# Patient Record
Sex: Female | Born: 1955 | Race: White | Hispanic: Yes | Smoking: Never smoker
Health system: Southern US, Community
[De-identification: ages and names within clinical notes are randomized; demographics above are authoritative.]

## PROBLEM LIST (undated history)

## (undated) DIAGNOSIS — K219 Gastro-esophageal reflux disease without esophagitis: Secondary | ICD-10-CM

## (undated) DIAGNOSIS — K8020 Calculus of gallbladder without cholecystitis without obstruction: Secondary | ICD-10-CM

## (undated) DIAGNOSIS — B019 Varicella without complication: Secondary | ICD-10-CM

## (undated) DIAGNOSIS — M8580 Other specified disorders of bone density and structure, unspecified site: Secondary | ICD-10-CM

## (undated) HISTORY — PX: TUBAL LIGATION: SHX77

## (undated) HISTORY — DX: Other specified disorders of bone density and structure, unspecified site: M85.80

## (undated) HISTORY — DX: Varicella without complication: B01.9

---
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
## 1990-09-07 HISTORY — PX: ABDOMINAL HYSTERECTOMY: SHX81

## 1999-09-04 ENCOUNTER — Encounter: Payer: Self-pay | Admitting: Infectious Diseases

## 1999-09-04 ENCOUNTER — Ambulatory Visit (HOSPITAL_COMMUNITY): Admission: RE | Admit: 1999-09-04 | Discharge: 1999-09-04 | Payer: Self-pay | Admitting: Infectious Diseases

## 2000-10-06 ENCOUNTER — Other Ambulatory Visit: Admission: RE | Admit: 2000-10-06 | Discharge: 2000-10-06 | Payer: Self-pay | Admitting: Gynecology

## 2001-02-18 ENCOUNTER — Other Ambulatory Visit: Admission: RE | Admit: 2001-02-18 | Discharge: 2001-02-18 | Payer: Self-pay | Admitting: Gynecology

## 2002-10-31 ENCOUNTER — Encounter: Payer: Self-pay | Admitting: Occupational Medicine

## 2002-10-31 ENCOUNTER — Encounter: Admission: RE | Admit: 2002-10-31 | Discharge: 2002-10-31 | Payer: Self-pay | Admitting: Occupational Medicine

## 2002-12-06 ENCOUNTER — Other Ambulatory Visit: Admission: RE | Admit: 2002-12-06 | Discharge: 2002-12-06 | Payer: Self-pay | Admitting: Gynecology

## 2003-08-22 ENCOUNTER — Other Ambulatory Visit: Admission: RE | Admit: 2003-08-22 | Discharge: 2003-08-22 | Payer: Self-pay | Admitting: Gynecology

## 2004-01-07 ENCOUNTER — Emergency Department (HOSPITAL_COMMUNITY): Admission: EM | Admit: 2004-01-07 | Discharge: 2004-01-07 | Payer: Self-pay | Admitting: Emergency Medicine

## 2004-04-30 ENCOUNTER — Ambulatory Visit (HOSPITAL_COMMUNITY): Admission: RE | Admit: 2004-04-30 | Discharge: 2004-04-30 | Payer: Self-pay | Admitting: *Deleted

## 2004-10-01 ENCOUNTER — Other Ambulatory Visit: Admission: RE | Admit: 2004-10-01 | Discharge: 2004-10-01 | Payer: Self-pay | Admitting: Obstetrics and Gynecology

## 2007-07-27 ENCOUNTER — Encounter: Admission: RE | Admit: 2007-07-27 | Discharge: 2007-07-27 | Payer: Self-pay | Admitting: Unknown Physician Specialty

## 2009-09-07 HISTORY — PX: COLONOSCOPY: SHX174

## 2010-03-11 ENCOUNTER — Ambulatory Visit: Payer: Self-pay | Admitting: Diagnostic Radiology

## 2010-03-12 ENCOUNTER — Emergency Department (HOSPITAL_BASED_OUTPATIENT_CLINIC_OR_DEPARTMENT_OTHER): Admission: EM | Admit: 2010-03-12 | Discharge: 2010-03-12 | Payer: Self-pay | Admitting: Emergency Medicine

## 2010-11-23 LAB — URINE MICROSCOPIC-ADD ON

## 2010-11-23 LAB — URINALYSIS, ROUTINE W REFLEX MICROSCOPIC
Bilirubin Urine: NEGATIVE
Nitrite: NEGATIVE
Specific Gravity, Urine: 1.017 (ref 1.005–1.030)
Urobilinogen, UA: 0.2 mg/dL (ref 0.0–1.0)

## 2010-11-23 LAB — PREGNANCY, URINE: Preg Test, Ur: NEGATIVE

## 2012-01-05 ENCOUNTER — Emergency Department (HOSPITAL_COMMUNITY)
Admission: EM | Admit: 2012-01-05 | Discharge: 2012-01-05 | Disposition: A | Discharge status: Nursing Home (Includes ICF) | Payer: 59 | Source: Home / Self Care | Attending: Emergency Medicine | Admitting: Emergency Medicine

## 2012-01-05 ENCOUNTER — Encounter (HOSPITAL_COMMUNITY): Payer: Self-pay | Admitting: *Deleted

## 2012-01-05 ENCOUNTER — Emergency Department (HOSPITAL_COMMUNITY)
Admission: EM | Admit: 2012-01-05 | Discharge: 2012-01-05 | Disposition: A | Discharge status: Home / Self Care | Payer: 59 | Attending: Emergency Medicine | Admitting: Emergency Medicine

## 2012-01-05 ENCOUNTER — Emergency Department (HOSPITAL_COMMUNITY): Payer: 59

## 2012-01-05 ENCOUNTER — Encounter (HOSPITAL_COMMUNITY): Payer: Self-pay | Admitting: Emergency Medicine

## 2012-01-05 DIAGNOSIS — R112 Nausea with vomiting, unspecified: Secondary | ICD-10-CM | POA: Insufficient documentation

## 2012-01-05 DIAGNOSIS — K3580 Unspecified acute appendicitis: Secondary | ICD-10-CM

## 2012-01-05 DIAGNOSIS — K219 Gastro-esophageal reflux disease without esophagitis: Secondary | ICD-10-CM | POA: Insufficient documentation

## 2012-01-05 DIAGNOSIS — Z79899 Other long term (current) drug therapy: Secondary | ICD-10-CM | POA: Insufficient documentation

## 2012-01-05 DIAGNOSIS — R109 Unspecified abdominal pain: Secondary | ICD-10-CM | POA: Insufficient documentation

## 2012-01-05 HISTORY — DX: Gastro-esophageal reflux disease without esophagitis: K21.9

## 2012-01-05 LAB — COMPREHENSIVE METABOLIC PANEL
Albumin: 3.8 g/dL (ref 3.5–5.2)
BUN: 17 mg/dL (ref 6–23)
Calcium: 9.2 mg/dL (ref 8.4–10.5)
Creatinine, Ser: 0.6 mg/dL (ref 0.50–1.10)
Potassium: 3.7 mEq/L (ref 3.5–5.1)
Total Protein: 7.3 g/dL (ref 6.0–8.3)

## 2012-01-05 LAB — DIFFERENTIAL
Basophils Relative: 0 % (ref 0–1)
Eosinophils Absolute: 0 10*3/uL (ref 0.0–0.7)
Eosinophils Relative: 0 % (ref 0–5)
Monocytes Absolute: 0.4 10*3/uL (ref 0.1–1.0)
Monocytes Relative: 3 % (ref 3–12)
Neutrophils Relative %: 92 % — ABNORMAL HIGH (ref 43–77)

## 2012-01-05 LAB — CBC
Hemoglobin: 13.1 g/dL (ref 12.0–15.0)
MCH: 30 pg (ref 26.0–34.0)
MCHC: 34.4 g/dL (ref 30.0–36.0)

## 2012-01-05 LAB — POCT URINALYSIS DIP (DEVICE)
Bilirubin Urine: NEGATIVE
Nitrite: NEGATIVE
pH: 6.5 (ref 5.0–8.0)

## 2012-01-05 LAB — LIPASE, BLOOD: Lipase: 70 U/L — ABNORMAL HIGH (ref 11–59)

## 2012-01-05 MED ORDER — HYDROMORPHONE HCL PF 1 MG/ML IJ SOLN
1.0000 mg | Freq: Once | INTRAMUSCULAR | Status: AC
  Administered 2012-01-05: 1 mg via INTRAVENOUS
  Filled 2012-01-05: qty 1

## 2012-01-05 MED ORDER — ONDANSETRON HCL 4 MG/2ML IJ SOLN
4.0000 mg | Freq: Once | INTRAMUSCULAR | Status: AC
  Administered 2012-01-05: 4 mg via INTRAVENOUS
  Filled 2012-01-05: qty 2

## 2012-01-05 MED ORDER — IOHEXOL 300 MG/ML  SOLN
20.0000 mL | INTRAMUSCULAR | Status: AC
  Administered 2012-01-05 (×2): 20 mL via ORAL

## 2012-01-05 MED ORDER — HYDROMORPHONE HCL PF 1 MG/ML IJ SOLN
0.5000 mg | Freq: Once | INTRAMUSCULAR | Status: AC
  Administered 2012-01-05: 17:00:00 via INTRAVENOUS
  Filled 2012-01-05: qty 1

## 2012-01-05 MED ORDER — ONDANSETRON 8 MG PO TBDP: 8.0000 mg | ORAL_TABLET | Freq: Three times a day (TID) | ORAL | Status: AC | PRN

## 2012-01-05 MED ORDER — SODIUM CHLORIDE 0.9 % IV SOLN
Freq: Once | INTRAVENOUS | Status: AC
  Administered 2012-01-05: 14:00:00 via INTRAVENOUS

## 2012-01-05 MED ORDER — OXYCODONE-ACETAMINOPHEN 5-325 MG PO TABS: 1.0000 | ORAL_TABLET | Freq: Four times a day (QID) | ORAL | Status: AC | PRN

## 2012-01-05 MED ORDER — IOHEXOL 300 MG/ML  SOLN
100.0000 mL | Freq: Once | INTRAMUSCULAR | Status: AC | PRN
  Administered 2012-01-05: 100 mL via INTRAVENOUS

## 2012-01-05 NOTE — ED Provider Notes (Signed)
Medical screening examination/treatment/procedure(s) were conducted as a shared visit with non-physician practitioner(s) and myself.  I personally evaluated the patient during the encounter   Michell Kader L Cottrell Gentles, MD 01/05/12 1517 

## 2012-01-05 NOTE — ED Provider Notes (Signed)
Patient in CDU pending completion of diagnostic testing in the evaluation of abdominal pain with nausea and vomiting.  Symptoms have improved after IV fluids and medications.  Lab and radiology results reviewed and discussed with patient and with Dr. Estell Harpin.  No clear cause of abdominal symptoms identified.  Patient has a PCP at [facility].   Patient is requested to contact her PCP tomorrow to schedule a follow-up appointment.  Prescriptions for antiemetic and analgesic provided.  Jimmye Norman, NP 01/05/12 1843

## 2012-01-05 NOTE — ED Provider Notes (Signed)
Chief Complaint  Patient presents with  . Abdominal Pain  . GI Problem    History of Present Illness:   Is a 56 year old female who was awakened at 4:30 AM today with left lower quadrant abdominal pain. She describes this as 7/10 in intensity and felt like a soreness. Since then the pain has shifted somewhat toward the right lower quadrant. It is worse if she lies flat in bed and is better if she gets up and walks around. She tried ibuprofen without much relief. She felt nauseated and vomited once. She denies any fever, chills, sweats, anorexia, weight loss. He does tend to be constipated but no blood in the stool. She occasionally has some urinary frequency but denies any dysuria or hematuria. She denies any GYN complaints. Last menstrual period was 3 years ago. She is postmenopausal. She has a history of reflux esophagitis for which she takes Nexium.  Review of Systems:  Other than noted above, the patient denies any of the following symptoms: Constitutional:  No fever, chills, fatigue, weight loss or anorexia. Lungs:  No cough or shortness of breath. Heart:  No chest pain, palpitations, syncope or edema.  No cardiac history. Abdomen:  No nausea, vomiting, hematememesis, melena, diarrhea, or hematochezia. GU:  No dysuria, frequency, urgency, or hematuria. Gyn:  No vaginal discharge, itching, abnormal bleeding or pelvic pain. Skin:  No rash or itching.  PMFSH:  Past medical history, family history, social history, meds, and allergies were reviewed.  No prior abdominal surgeries, past history of GI problems, STDs or GYN problems.  No history of aspirin or NSAID use.  No excessive alcohol intake.  Physical Exam:   Vital signs:  BP 124/63  Pulse 63  Temp(Src) 97.5 F (36.4 C) (Oral)  Resp 16  SpO2 100% Gen:  Alert, oriented, in no distress. Lungs:  Breath sounds clear and equal bilaterally.  No wheezes, rales or rhonchi. Heart:  Regular rhythm.  No gallops or murmers.   Abdomen:  Abdomen  is soft and flat. Bowel sounds are normally active. She has tenderness to palpation in both lower quadrants with guarding in the right lower quadrant. Tenderness is worse in the right lower quadrant of the left. There is no rebound. No organomegaly or mass. Rosvig sign is negative, obturator sign is negative, iliopsoas sign is negative. Skin:  Clear, warm and dry.  No rash.  Labs:   Results for orders placed during the hospital encounter of 01/05/12  POCT URINALYSIS DIP (DEVICE)      Component Value Range   Glucose, UA NEGATIVE  NEGATIVE (mg/dL)   Bilirubin Urine NEGATIVE  NEGATIVE    Ketones, ur 15 (*) NEGATIVE (mg/dL)   Specific Gravity, Urine 1.025  1.005 - 1.030    Hgb urine dipstick TRACE (*) NEGATIVE    pH 6.5  5.0 - 8.0    Protein, ur 30 (*) NEGATIVE (mg/dL)   Urobilinogen, UA 0.2  0.0 - 1.0 (mg/dL)   Nitrite NEGATIVE  NEGATIVE    Leukocytes, UA TRACE (*) NEGATIVE     Assessment:  The encounter diagnosis was Acute appendicitis.  Plan:   1.  The following meds were prescribed:   New Prescriptions   No medications on file   2.  The patient was transferred to the emergency department via shuttle.  Reuben Likes, MD 01/05/12 [phone]

## 2012-01-05 NOTE — ED Notes (Signed)
Pt. Returned from Ct. Scan

## 2012-01-05 NOTE — Discharge Instructions (Signed)
We have determined that your problem requires further evaluation in the emergency department.  We will take care of your transport there.  Once at the emergency department, you will be evaluated by a provider and they will order whatever treatment or tests they deem necessary.  We cannot guarantee that they will do any specific test or do any specific treatment.  ° °

## 2012-01-05 NOTE — ED Provider Notes (Signed)
Medical screening examination/treatment/procedure(s) were conducted as a shared visit with non-physician practitioner(s) and myself.  I personally evaluated the patient during the encounter   Benny Lennert, MD 01/05/12 [phone]

## 2012-01-05 NOTE — ED Notes (Signed)
Pt sent from ucc with developing LLQ pain this am and now radiated to RLQ and tender upon palpation.  Pt is nauseated at triage

## 2012-01-05 NOTE — ED Provider Notes (Signed)
History     CSN: [id number]  Arrival date & time 01/05/12  1253   First MD Initiated Contact with Patient 01/05/12 1346      Chief Complaint  Patient presents with  . R/O appendicitis--from ucc     (Consider location/radiation/quality/duration/timing/severity/associated sxs/prior treatment) Patient is a 56 y.o. female presenting with abdominal pain. The history is provided by the patient (pt complains of abd pain since 430am today). No language interpreter was used.  Abdominal Pain The primary symptoms of the illness include abdominal pain. The primary symptoms of the illness do not include fatigue or diarrhea. The current episode started 3 to 5 hours ago. The onset of the illness was sudden. The problem has not changed since onset. Associated with: nauseau. The patient states that she believes she is currently not pregnant. The patient has not had a change in bowel habit. Symptoms associated with the illness do not include heartburn, hematuria, frequency or back pain. Significant associated medical issues do not include PUD.    Past Medical History  Diagnosis Date  . Acid reflux     History reviewed. No pertinent past surgical history.  No family history on file.  History  Substance Use Topics  . Smoking status: Never Smoker   . Smokeless tobacco: Not on file  . Alcohol Use: No    OB History    Grav Para Term Preterm Abortions TAB SAB Ect Mult Living                  Review of Systems  Constitutional: Negative for fatigue.  HENT: Negative for congestion, sinus pressure and ear discharge.   Eyes: Negative for discharge.  Respiratory: Negative for cough.   Cardiovascular: Negative for chest pain.  Gastrointestinal: Positive for abdominal pain. Negative for heartburn and diarrhea.  Genitourinary: Negative for frequency and hematuria.  Musculoskeletal: Negative for back pain.  Skin: Negative for rash.  Neurological: Negative for seizures and headaches.    Hematological: Negative.   Psychiatric/Behavioral: Negative for hallucinations.    Allergies  Pollen extract  Home Medications   Current Outpatient Rx  Name Route Sig Dispense Refill  . ERGOCALCIFEROL 50000 UNITS PO CAPS Oral Take 50,000 Units by mouth once a week. Saturday    . ESOMEPRAZOLE MAGNESIUM 40 MG PO CPDR Oral Take 40 mg by mouth daily before supper. Take one hour before supper.    . IBUPROFEN 200 MG PO TABS Oral Take 20-800 mg by mouth every 6 (six) hours as needed. As needed for pain.    [name] Kitchen OMEGA-3-ACID ETHYL ESTERS 1 G PO CAPS Oral Take 1 g by mouth daily.      BP 116/57  Pulse 66  Resp 18  SpO2 100%  Physical Exam  Constitutional: She is oriented to person, place, and time. She appears well-developed.  HENT:  Head: Normocephalic and atraumatic.  Eyes: Conjunctivae and EOM are normal. No scleral icterus.  Neck: Neck supple. No thyromegaly present.  Cardiovascular: Normal rate and regular rhythm.  Exam reveals no gallop and no friction rub.   No murmur heard. Pulmonary/Chest: No stridor. She has no wheezes. She has no rales. She exhibits no tenderness.  Abdominal: She exhibits no distension. There is tenderness. There is no rebound.       Moderate epigstric and rlq tendernous  Musculoskeletal: Normal range of motion. She exhibits no edema.  Lymphadenopathy:    She has no cervical adenopathy.  Neurological: She is oriented to person, place, and time. Coordination normal.  Skin:  No rash noted. No erythema.  Psychiatric: She has a normal mood and affect. Her behavior is normal.    ED Course  Procedures (including critical care time)   Labs Reviewed  CBC  DIFFERENTIAL  COMPREHENSIVE METABOLIC PANEL  LIPASE, BLOOD   No results found.   No diagnosis found.    MDM          Benny Lennert, MD 01/05/12 [phone]

## 2012-01-05 NOTE — ED Notes (Signed)
Pt here with left lower abdominal  Achy pain ,nonradiating with nausea and 1 episode of vomiting that started last night after eating at fast food restaurant.denies fever,chills or diarrhea but states she has to drink prune juice EOD FOR effective bm's.hx acid reflux and takes nexium

## 2012-01-05 NOTE — ED Provider Notes (Signed)
Patient is sent to the clinical decision unit by Dr. Estell Harpin for pending CT abdomen and pelvis to rule out appendicitis or any other acute intra-abdominal process. Patient was initially seen in urgent care Center to emergency department for further evaluation right lower quadrant pain. Pending labs and CT scan. Patient is afebrile and in no acute distress. Will continue to monitor and manage pain while remainder of the workup is in progress. She has a nonacute abdomen at this time.  Jenness Corner, [place] 01/05/12 1421

## 2012-01-05 NOTE — Discharge Instructions (Signed)
Nausea and Vomiting  Nausea is a sick feeling that often comes before throwing up (vomiting). Vomiting is a reflex where stomach contents come out of your mouth. Vomiting can cause severe loss of body fluids (dehydration). Children and elderly adults can become dehydrated quickly, especially if they also have diarrhea. Nausea and vomiting are symptoms of a condition or disease. It is important to find the cause of your symptoms.  CAUSES   · Direct irritation of the stomach lining. This irritation can result from increased acid production (gastroesophageal reflux disease), infection, food poisoning, taking certain medicines (such as nonsteroidal anti-inflammatory drugs), alcohol use, or tobacco use.  · Signals from the brain. These signals could be caused by a headache, heat exposure, an inner ear disturbance, increased pressure in the brain from injury, infection, a tumor, or a concussion, pain, emotional stimulus, or metabolic problems.  · An obstruction in the gastrointestinal tract (bowel obstruction).  · Illnesses such as diabetes, hepatitis, gallbladder problems, appendicitis, kidney problems, cancer, sepsis, atypical symptoms of a heart attack, or eating disorders.  · Medical treatments such as chemotherapy and radiation.  · Receiving medicine that makes you sleep (general anesthetic) during surgery.  DIAGNOSIS  Your caregiver may ask for tests to be done if the problems do not improve after a few days. Tests may also be done if symptoms are severe or if the reason for the nausea and vomiting is not clear. Tests may include:  · Urine tests.  · Blood tests.  · Stool tests.  · Cultures (to look for evidence of infection).  · X-rays or other imaging studies.  Test results can help your caregiver make decisions about treatment or the need for additional tests.  TREATMENT  You need to stay well hydrated. Drink frequently but in small amounts. You may wish to drink water, sports drinks, clear broth, or eat frozen  ice pops or gelatin dessert to help stay hydrated. When you eat, eating slowly may help prevent nausea. There are also some antinausea medicines that may help prevent nausea.  HOME CARE INSTRUCTIONS   · Take all medicine as directed by your caregiver.  · If you do not have an appetite, do not force yourself to eat. However, you must continue to drink fluids.  · If you have an appetite, eat a normal diet unless your caregiver tells you differently.  · Eat a variety of complex carbohydrates (rice, wheat, potatoes, bread), lean meats, yogurt, fruits, and vegetables.  · Avoid high-fat foods because they are more difficult to digest.  · Drink enough water and fluids to keep your urine clear or pale yellow.  · If you are dehydrated, ask your caregiver for specific rehydration instructions. Signs of dehydration may include:  · Severe thirst.  · Dry lips and mouth.  · Dizziness.  · Dark urine.  · Decreasing urine frequency and amount.  · Confusion.  · Rapid breathing or pulse.  SEEK IMMEDIATE MEDICAL CARE IF:   · You have blood or brown flecks (like coffee grounds) in your vomit.  · You have black or bloody stools.  · You have a severe headache or stiff neck.  · You are confused.  · You have severe abdominal pain.  · You have chest pain or trouble breathing.  · You do not urinate at least once every 8 hours.  · You develop cold or clammy skin.  · You continue to vomit for longer than 24 to 48 hours.  · You have a fever.  MAKE SURE YOU:   ·   Understand these instructions.  · Will watch your condition.  · Will get help right away if you are not doing well or get worse.  Document Released: 08/24/2005 Document Revised: 08/13/2011 Document Reviewed: 01/21/2011  ExitCare® Patient Information ©2012 [employer].    Abdominal Pain (Nonspecific)  Your exam might not show the exact reason you have abdominal pain. Since there are many different causes of abdominal pain, another checkup and more tests may be needed. It is very  important to follow up for lasting (persistent) or worsening symptoms. A possible cause of abdominal pain in any person who still has his or her appendix is acute appendicitis. Appendicitis is often hard to diagnose. Normal blood tests, urine tests, ultrasound, and CT scans do not completely rule out early appendicitis or other causes of abdominal pain. Sometimes, only the changes that happen over time will allow appendicitis and other causes of abdominal pain to be determined. Other potential problems that may require surgery may also take time to become more apparent. Because of this, it is important that you follow all of the instructions below.  HOME CARE INSTRUCTIONS   · Rest as much as possible.  · Do not eat solid food until your pain is gone.  · While adults or children have pain: A diet of water, weak decaffeinated tea, broth or bouillon, gelatin, oral rehydration solutions (ORS), frozen ice pops, or ice chips may be helpful.  · When pain is gone in adults or children: Start a light diet (dry toast, crackers, applesauce, or white rice). Increase the diet slowly as long as it does not bother you. Eat no dairy products (including cheese and eggs) and no spicy, fatty, fried, or high-fiber foods.  · Use no alcohol, caffeine, or cigarettes.  · Take your regular medicines unless your caregiver told you not to.  · Take any prescribed medicine as directed.  · Only take over-the-counter or prescription medicines for pain, discomfort, or fever as directed by your caregiver. Do not give aspirin to children.  If your caregiver has given you a follow-up appointment, it is very important to keep that appointment. Not keeping the appointment could result in a permanent injury and/or lasting (chronic) pain and/or disability. If there is any problem keeping the appointment, you must call to reschedule.   SEEK IMMEDIATE MEDICAL CARE IF:   · Your pain is not gone in 24 hours.  · Your pain becomes worse, changes location, or  feels different.  · You or your child has an oral temperature above 102° F (38.9° C), not controlled by medicine.  · Your baby is older than 3 months with a rectal temperature of 102° F (38.9° C) or higher.  · Your baby is 3 months old or younger with a rectal temperature of 100.4° F (38° C) or higher.  · You have shaking chills.  · You keep throwing up (vomiting) or cannot drink liquids.  · There is blood in your vomit or you see blood in your bowel movements.  · Your bowel movements become dark or black.  · You have frequent bowel movements.  · Your bowel movements stop (become blocked) or you cannot pass gas.  · You have bloody, frequent, or painful urination.  · You have yellow discoloration in the skin or whites of the eyes.  · Your stomach becomes bloated or bigger.  · You have dizziness or fainting.  · You have chest or back pain.  MAKE SURE YOU:   · Understand these   instructions.  · Will watch your condition.  · Will get help right away if you are not doing well or get worse.  Document Released: 08/24/2005 Document Revised: 08/13/2011 Document Reviewed: 07/22/2009  ExitCare® Patient Information ©2012 [employer].

## 2012-01-18 ENCOUNTER — Encounter: Payer: Self-pay | Admitting: Gastroenterology

## 2012-01-18 ENCOUNTER — Telehealth: Payer: Self-pay | Admitting: Gastroenterology

## 2012-01-18 NOTE — Telephone Encounter (Signed)
Pt states she is having a lot of pain from her reflux and stomach pain. States she cannot wait until June to be seen. Pt scheduled to see Mike Gip PA tomorrow at 10am. Pt aware of appt date and time.

## 2012-01-19 ENCOUNTER — Other Ambulatory Visit (INDEPENDENT_AMBULATORY_CARE_PROVIDER_SITE_OTHER): Payer: 59

## 2012-01-19 ENCOUNTER — Ambulatory Visit (INDEPENDENT_AMBULATORY_CARE_PROVIDER_SITE_OTHER)
Admission: RE | Admit: 2012-01-19 | Discharge: 2012-01-19 | Disposition: A | Discharge status: Home / Self Care | Payer: 59 | Source: Ambulatory Visit | Attending: Physician Assistant | Admitting: Physician Assistant

## 2012-01-19 ENCOUNTER — Ambulatory Visit (INDEPENDENT_AMBULATORY_CARE_PROVIDER_SITE_OTHER): Payer: 59 | Admitting: Physician Assistant

## 2012-01-19 ENCOUNTER — Encounter: Payer: Self-pay | Admitting: Physician Assistant

## 2012-01-19 VITALS — BP 110/70 | HR 76 | Ht 59.0 in | Wt 122.6 lb

## 2012-01-19 DIAGNOSIS — R935 Abnormal findings on diagnostic imaging of other abdominal regions, including retroperitoneum: Secondary | ICD-10-CM

## 2012-01-19 DIAGNOSIS — R109 Unspecified abdominal pain: Secondary | ICD-10-CM

## 2012-01-19 DIAGNOSIS — R1030 Lower abdominal pain, unspecified: Secondary | ICD-10-CM

## 2012-01-19 DIAGNOSIS — Z90710 Acquired absence of both cervix and uterus: Secondary | ICD-10-CM | POA: Insufficient documentation

## 2012-01-19 DIAGNOSIS — K219 Gastro-esophageal reflux disease without esophagitis: Secondary | ICD-10-CM

## 2012-01-19 LAB — CBC WITH DIFFERENTIAL/PLATELET
Eosinophils Relative: 0.6 % (ref 0.0–5.0)
HCT: 40.3 % (ref 36.0–46.0)
Hemoglobin: 13.4 g/dL (ref 12.0–15.0)
Lymphocytes Relative: 23.5 % (ref 12.0–46.0)
Lymphs Abs: 1.8 10*3/uL (ref 0.7–4.0)
Monocytes Relative: 4.3 % (ref 3.0–12.0)
Neutro Abs: 5.6 10*3/uL (ref 1.4–7.7)
RBC: 4.5 Mil/uL (ref 3.87–5.11)
WBC: 7.8 10*3/uL (ref 4.5–10.5)

## 2012-01-19 LAB — HEPATIC FUNCTION PANEL
AST: 19 U/L (ref 0–37)
Albumin: 3.9 g/dL (ref 3.5–5.2)
Alkaline Phosphatase: 59 U/L (ref 39–117)
Bilirubin, Direct: 0.1 mg/dL (ref 0.0–0.3)
Total Protein: 7.6 g/dL (ref 6.0–8.3)

## 2012-01-19 LAB — LIPASE: Lipase: 50 U/L (ref 11.0–59.0)

## 2012-01-19 MED ORDER — IOHEXOL 300 MG/ML  SOLN
100.0000 mL | Freq: Once | INTRAMUSCULAR | Status: AC | PRN
  Administered 2012-01-19: 100 mL via INTRAVENOUS

## 2012-01-19 MED ORDER — DEXLANSOPRAZOLE 60 MG PO CPDR: 60.0000 mg | DELAYED_RELEASE_CAPSULE | Freq: Every day | ORAL | Status: DC
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
## 2012-01-19 NOTE — Patient Instructions (Signed)
Please go to the basement level to have your labs drawn.  We sent Dexilant prescription to [facility].  We have scheduled the CT Enterography for today, Directions given.  You have been scheduled for a CT scan of the abdomen and pelvis at  CT ([address]---this is in the same building as Architectural technologist).   You are scheduled on 01-19-2012 at 4:00 PM  You should arrive 15 minutes prior to your appointment time for registration. Please follow the written instructions below on the day of your exam:  WARNING: IF YOU ARE ALLERGIC TO IODINE/X-RAY DYE, PLEASE NOTIFY RADIOLOGY IMMEDIATELY AT [phone]! YOU WILL BE GIVEN A 13 HOUR PREMEDICATION PREP.  1) Do not eat or drink anything after Noon  (4 hours prior to your test) You will arrive at 2:45 PM and will drink contrast at [facility] CT office.   You may take any medications as prescribed with a small amount of water except for the following: Metformin, Glucophage, Glucovance, Avandamet, Riomet, Fortamet, Actoplus Met, Janumet, Glumetza or Metaglip. The above medications must be held the day of the exam AND 48 hours after the exam.  The purpose of you drinking the oral contrast is to aid in the visualization of your intestinal tract. The contrast solution may cause some diarrhea. Before your exam is started, you will be given a small amount of fluid to drink. Depending on your individual set of symptoms, you may also receive an intravenous injection of x-ray contrast/dye. Plan on being at [facility] for 30 minutes or long, depending on the type of exam you are having performed.  If you have any questions regarding your exam or if you need to reschedule, you may call the CT department at [phone] between the hours of 8:00 am and 5:00 pm, Monday-Friday.  ________________________________________________________________________

## 2012-01-19 NOTE — Progress Notes (Signed)
i agree with the plan outlined in this note 

## 2012-01-20 ENCOUNTER — Telehealth: Payer: Self-pay | Admitting: Physician Assistant

## 2012-01-20 NOTE — Telephone Encounter (Signed)
Spoke with patient and told her per Mike Gip, PA note the CT is negative but she is having the MD review it and decide if she will need a colonoscopy.

## 2012-01-22 ENCOUNTER — Encounter: Payer: Self-pay | Admitting: *Deleted

## 2012-01-22 ENCOUNTER — Telehealth: Payer: Self-pay | Admitting: Physician Assistant

## 2012-01-22 NOTE — Telephone Encounter (Signed)
Letter up front for pick up by patient.

## 2012-01-22 NOTE — Telephone Encounter (Signed)
Yes, that is ok. thanks

## 2012-01-22 NOTE — Telephone Encounter (Signed)
Patient saw Mike Gip, PA on 01/19/12 for Left Lower abdominal pain and was given a note to be out of work this week. She needs  A note to be released to go back to work. Dr. Christella Hartigan, Amy is off today and you were supervising on 01/19/12. May I give her a note to return to work on Monday? Please, advise.

## 2012-01-27 ENCOUNTER — Telehealth: Payer: Self-pay | Admitting: *Deleted

## 2012-01-27 NOTE — Telephone Encounter (Signed)
Spoke with patient and scheduled OV on 02/29/12 8:45 AM with Dr. Christella Hartigan

## 2012-01-27 NOTE — Telephone Encounter (Signed)
Message copied by Daphine Deutscher on Wed Jan 27, 2012  8:57 AM ------      Message from: Mike Gip S      Created: Tue Jan 26, 2012  5:23 PM       [facility], just get her to have a follow up  appt with dr. Christella Hartigan

## 2012-02-23 ENCOUNTER — Ambulatory Visit: Payer: 59 | Admitting: Gastroenterology

## 2012-02-29 ENCOUNTER — Ambulatory Visit (INDEPENDENT_AMBULATORY_CARE_PROVIDER_SITE_OTHER): Payer: 59 | Admitting: Gastroenterology

## 2012-02-29 ENCOUNTER — Encounter: Payer: Self-pay | Admitting: Gastroenterology

## 2012-02-29 VITALS — BP 110/60 | HR 74 | Ht 59.0 in | Wt 126.0 lb

## 2012-02-29 DIAGNOSIS — R1032 Left lower quadrant pain: Secondary | ICD-10-CM

## 2012-02-29 MED ORDER — ESOMEPRAZOLE MAGNESIUM 40 MG PO CPDR: 40.0000 mg | DELAYED_RELEASE_CAPSULE | Freq: Every day | ORAL | Status: DC

## 2012-02-29 NOTE — Progress Notes (Signed)
HPI: This is a  very pleasant 56 year old woman whom I am meeting for the first time. She was seen in this office by Amy about a month ago. She was having left-sided abdominal planes of unclear etiology they seemed very positional. She had had a colonoscopy by Dr. Noe Gens in 2011 and tells [place] it was normal. She was set up with a CT enterography and it was normal.  "I feel better."  The pains have completely resolved.  Pains were left side, very positional related.  Gone now.  She has been eating well, normal bowels for her (chronic constipation).  Drinks a lot of water, veggies, fruits.   CT enteroscopy last was normal  Past Medical History  Diagnosis Date  . Acid reflux     Past Surgical History  Procedure Date  . Abdominal hysterectomy   . Colonoscopy 2010    Dr.     Current Outpatient Prescriptions  Medication Sig Dispense Refill  . ergocalciferol (VITAMIN D2) 50000 UNITS capsule Take 50,000 Units by mouth once a week. Saturday      . esomeprazole (NEXIUM) 40 MG capsule Take 40 mg by mouth daily before supper. Take one hour before supper.      [name] Kitchen ibuprofen (ADVIL,MOTRIN) 200 MG tablet Take 20-800 mg by mouth every 6 (six) hours as needed. As needed for pain.      [name] Kitchen omega-3 acid ethyl esters (LOVAZA) 1 G capsule Take 1 g by mouth daily.        Allergies as of 02/29/2012 - Review Complete 02/29/2012  Allergen Reaction Noted  . Pollen extract Other (See Comments) 01/05/2012    Family History  Problem Relation Age of Onset  . Colon cancer Neg Hx     History   Social History  . Marital Status: Widowed    Spouse Name: N/A    Number of Children: 2  . 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 Smoking status: Never Smoker   . Smokeless tobacco: Never Used  . Alcohol Use: No  . Drug Use: No  . Sexually Active: Not on file   Other Topics Concern  . Not on file   Social History Narrative  . No narrative  on file      Physical Exam: BP 110/60  Pulse 74  Ht 4\' 11"  (1.499 m)  Wt 126 lb (57.153 kg)  BMI 25.45 kg/m2 Constitutional: generally well-appearing Psychiatric: alert and oriented x3 Abdomen: soft, nontender, nondistended, no obvious ascites, no peritoneal signs, normal bowel sounds     Assessment and plan: 55 y.o. female with resolved left-sided abdominal pains  Given normal CT scan, recently normal colonoscopy in 2011, essentially normal labs I see no reason for further workup at this point since her pains have resolved. She is to call here she has any further questions or concerns

## 2012-02-29 NOTE — Patient Instructions (Addendum)
Since you are feeling so much better, there is no need for repeat colonoscopy. Call with any future troubles.

## 2012-08-17 DIAGNOSIS — L84 Corns and callosities: Secondary | ICD-10-CM | POA: Insufficient documentation

## 2012-08-17 DIAGNOSIS — B353 Tinea pedis: Secondary | ICD-10-CM | POA: Insufficient documentation

## 2012-08-17 DIAGNOSIS — M79672 Pain in left foot: Secondary | ICD-10-CM | POA: Insufficient documentation

## 2012-08-17 DIAGNOSIS — M21969 Unspecified acquired deformity of unspecified lower leg: Secondary | ICD-10-CM | POA: Insufficient documentation

## 2012-08-17 DIAGNOSIS — M79671 Pain in right foot: Secondary | ICD-10-CM | POA: Insufficient documentation

## 2012-08-17 DIAGNOSIS — M6700 Short Achilles tendon (acquired), unspecified ankle: Secondary | ICD-10-CM | POA: Insufficient documentation

## 2012-08-20 ENCOUNTER — Encounter: Payer: Self-pay | Admitting: Podiatry

## 2012-08-20 DIAGNOSIS — L84 Corns and callosities: Secondary | ICD-10-CM

## 2012-08-20 DIAGNOSIS — M21969 Unspecified acquired deformity of unspecified lower leg: Secondary | ICD-10-CM

## 2012-08-20 DIAGNOSIS — B353 Tinea pedis: Secondary | ICD-10-CM

## 2012-08-20 DIAGNOSIS — M79672 Pain in left foot: Secondary | ICD-10-CM

## 2012-08-20 DIAGNOSIS — M79671 Pain in right foot: Secondary | ICD-10-CM

## 2012-08-20 DIAGNOSIS — M6700 Short Achilles tendon (acquired), unspecified ankle: Secondary | ICD-10-CM

## 2012-09-01 HISTORY — PX: OTHER SURGICAL HISTORY: SHX169

## 2012-11-04 ENCOUNTER — Encounter: Payer: Self-pay | Admitting: Gastroenterology

## 2012-11-04 ENCOUNTER — Ambulatory Visit (INDEPENDENT_AMBULATORY_CARE_PROVIDER_SITE_OTHER): Payer: 59 | Admitting: Gastroenterology

## 2012-11-04 VITALS — BP 104/70 | HR 84 | Ht 58.25 in | Wt 130.2 lb

## 2012-11-04 DIAGNOSIS — K219 Gastro-esophageal reflux disease without esophagitis: Secondary | ICD-10-CM

## 2012-11-04 DIAGNOSIS — R1314 Dysphagia, pharyngoesophageal phase: Secondary | ICD-10-CM

## 2012-11-04 NOTE — Patient Instructions (Addendum)
Continue dexilant daily. Start OTC pepcid, take at bedtime every night. You will be set up for an upper endoscopy for pyrosis, dysphagia (moderate sedation, LEC).

## 2012-11-04 NOTE — Progress Notes (Signed)
Review of pertinent gastrointestinal problems: 1. Routine risk for colon cancer: colonsocopy Dr. Noe Gens in 2011, per patient was normal 2. Intermittent abd pain: CT abd/pelvis scan twice in 2013 essentially normal; labs 2013 normal CBC, normal CMET   HPI: This is a  very pleasant 57 year old woman whom I last saw about a year ago.    The llq pains have stopped.    Here for a new problem.  GERD for many years.  Took nexium for 2 years, worked well.  Currently taking dexlinant once a day for past 3 months.  Still bothered by pyrosis and belching.  + dysphagia to solids about once per month.   Overall weight is up 7-8 pounds in past 2 months.  Ibuprofen rarely.    Rare caffeine, no etoh,    she tells me her pyrosis symptoms are clearly worse at night, when she's laying down in bed.   Review of systems: Pertinent positive and negative review of systems were noted in the above HPI section. Complete review of systems was performed and was otherwise normal.    Past Medical History  Diagnosis Date  . Acid reflux     Past Surgical History  Procedure Laterality Date  . Abdominal hysterectomy    . Colonoscopy  2010    Dr.   . [place] [place] surgery Left     bone spur    Current Outpatient Prescriptions  Medication Sig Dispense Refill  . dexlansoprazole (DEXILANT) 60 MG capsule Take 60 mg by mouth daily.      [name] Kitchen ibuprofen (ADVIL,MOTRIN) 200 MG tablet Take 20-800 mg by mouth every 6 (six) hours as needed. As needed for pain.      . Vitamin D, Cholecalciferol, 400 UNITS CHEW Chew 1 tablet by mouth 2 (two) times daily.       No current facility-administered medications for this visit.    Allergies as of 11/04/2012 - Review Complete 11/04/2012  Allergen Reaction Noted  . Pollen extract Other (See Comments) 01/05/2012    Family History  Problem Relation Age of Onset  . Colon cancer Neg Hx     History   Social History  . Marital Status: Widowed    Spouse Name: N/A    Number of  Children: 2  . 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 Smoking status: Never Smoker   . Smokeless tobacco: Never Used  . Alcohol Use: No  . Drug Use: No  . Sexually Active: Not on file   Other Topics Concern  . Not on file   Social History Narrative  . No narrative on file       Physical Exam: Ht 4' 10.25" (1.48 m)  Wt 130 lb 4 oz (59.081 kg)  BMI 26.97 kg/m2 Constitutional: generally well-appearing Psychiatric: alert and oriented x3 Eyes: extraocular movements intact Mouth: oral pharynx moist, no lesions Neck: supple no lymphadenopathy Cardiovascular: heart regular rate and rhythm Lungs: clear to auscultation bilaterally Abdomen: soft, nontender, nondistended, no obvious ascites, no peritoneal signs, normal bowel sounds Extremities: no lower extremity edema bilaterally Skin: no lesions on visible extremities    Assessment and plan: 57 y.o. female with  chronic GERD, worse at night, intermittent dysphasia  should proceed with EGD at her soonest convenience to evaluate for GERD can medication such as strictures, perhaps Barrett's esophagus or neoplasm however I think that is unlikely. Her GERD symptoms seem worse at night I recommended she  try bedtime H2 blocker in addition to her daily proton pump inhibitor

## 2012-11-07 ENCOUNTER — Encounter: Payer: Self-pay | Admitting: Gastroenterology

## 2012-11-07 ENCOUNTER — Ambulatory Visit (AMBULATORY_SURGERY_CENTER): Payer: 59 | Admitting: Gastroenterology

## 2012-11-07 VITALS — BP 99/50 | HR 55 | Temp 96.6°F | Resp 12 | Ht 59.0 in | Wt 122.0 lb

## 2012-11-07 DIAGNOSIS — R1314 Dysphagia, pharyngoesophageal phase: Secondary | ICD-10-CM

## 2012-11-07 DIAGNOSIS — K219 Gastro-esophageal reflux disease without esophagitis: Secondary | ICD-10-CM

## 2012-11-07 DIAGNOSIS — R1032 Left lower quadrant pain: Secondary | ICD-10-CM

## 2012-11-07 MED ORDER — SODIUM CHLORIDE 0.9 % IV SOLN: 500.0000 mL | INTRAVENOUS | Status: DC

## 2012-11-07 NOTE — Progress Notes (Signed)
Patient did not experience any of the following events: a burn prior to discharge; a fall within the facility; wrong site/side/patient/procedure/implant event; or a hospital transfer or hospital admission upon discharge from the facility. (G8907) Patient did not have preoperative order for IV antibiotic SSI prophylaxis. (G8918)  

## 2012-11-07 NOTE — Op Note (Signed)
[facility] [address] [place], [address]   ENDOSCOPY PROCEDURE REPORT  PATIENT: Marissa, Grant  MR#: [id number] BIRTHDATE: July 19, 1956 , 57  yrs. old GENDER: Female ENDOSCOPIST: Rachael Fee, MD PROCEDURE DATE:  11/07/2012 PROCEDURE:  EGD, diagnostic ASA CLASS:     Class III INDICATIONS:  GERD, intermittent dysphagia. MEDICATIONS: Fentanyl 50 mcg IV, Versed 6 mg IV, and These medications were titrated to patient response per physician's verbal order TOPICAL ANESTHETIC: Cetacaine Spray  DESCRIPTION OF PROCEDURE: After the risks benefits and alternatives of the procedure were thoroughly explained, informed consent was obtained.  The LB GIF-H180 [id number] endoscope was introduced through the mouth and advanced to the second portion of the duodenum. Without limitations.  The instrument was slowly withdrawn as the mucosa was fully examined.    The upper, middle and distal third of the esophagus were carefully inspected and no abnormalities were noted.  The z-line was well seen at the GEJ.  The endoscope was pushed into the fundus which was normal including a retroflexed view.  The antrum, gastric body, first and second part of the duodenum were unremarkable. Retroflexed views revealed no abnormalities.     The scope was then withdrawn from the patient and the procedure completed. COMPLICATIONS: There were no complications.  ENDOSCOPIC IMPRESSION: Normal EGD  RECOMMENDATIONS: Please continue dexilant once daily and also start H2 blocker (such as pepcid or zantac), one pill every night at bedtime.    eSigned:  Rachael Fee, MD 11/07/2012 10:30 AM

## 2012-11-07 NOTE — Patient Instructions (Signed)
Continue your Dexilant once daily. Add a H2 blocker,Pepcid or Zantac  One pil every night.   YOU HAD AN ENDOSCOPIC PROCEDURE TODAY AT THE [facility]: Refer to the procedure report that was given to you for any specific questions about what was found during the examination.  If the procedure report does not answer your questions, please call your gastroenterologist to clarify.  If you requested that your care partner not be given the details of your procedure findings, then the procedure report has been included in a sealed envelope for you to review at your convenience later.  YOU SHOULD EXPECT: Some feelings of bloating in the abdomen. Passage of more gas than usual.  Walking can help get rid of the air that was put into your GI tract during the procedure and reduce the bloating. If you had a lower endoscopy (such as a colonoscopy or flexible sigmoidoscopy) you may notice spotting of blood in your stool or on the toilet paper. If you underwent a bowel prep for your procedure, then you may not have a normal bowel movement for a few days.  DIET: Your first meal following the procedure should be a light meal and then it is ok to progress to your normal diet.  A half-sandwich or bowl of soup is an example of a good first meal.  Heavy or fried foods are harder to digest and may make you feel nauseous or bloated.  Likewise meals heavy in dairy and vegetables can cause extra gas to form and this can also increase the bloating.  Drink plenty of fluids but you should avoid alcoholic beverages for 24 hours.  ACTIVITY: Your care partner should take you home directly after the procedure.  You should plan to take it easy, moving slowly for the rest of the day.  You can resume normal activity the day after the procedure however you should NOT DRIVE or use heavy machinery for 24 hours (because of the sedation medicines used during the test).    SYMPTOMS TO REPORT IMMEDIATELY: A gastroenterologist can be  reached at any hour.  During normal business hours, 8:30 AM to 5:00 PM Monday through Friday, call [phone].  After hours and on weekends, please call the GI answering service at [phone] who will take a message and have the physician on call contact you.  Following upper endoscopy (EGD)  Vomiting of blood or coffee ground material  New chest pain or pain under the shoulder blades  Painful or persistently difficult swallowing  New shortness of breath  Fever of 100F or higher  Black, tarry-looking stools  FOLLOW UP: If any biopsies were taken you will be contacted by phone or by letter within the next 1-3 weeks.  Call your gastroenterologist if you have not heard about the biopsies in 3 weeks.  Our staff will call the home number listed on your records the next business day following your procedure to check on you and address any questions or concerns that you may have at that time regarding the information given to you following your procedure. This is a courtesy call and so if there is no answer at the home number and we have not heard from you through the emergency physician on call, we will assume that you have returned to your regular daily activities without incident.  SIGNATURES/CONFIDENTIALITY: You and/or your care partner have signed paperwork which will be entered into your electronic medical record.  These signatures attest to the fact that that  the information above on your After Visit Summary has been reviewed and is understood.  Full responsibility of the confidentiality of this discharge information lies with you and/or your care-partner.

## 2012-11-07 NOTE — Progress Notes (Signed)
Patient drinking water on arrival, MD notified. Procedure delayed until 2 hours NPO. Patient aware.

## 2012-11-07 NOTE — Progress Notes (Signed)
Patient's head of bed elevated gradually until 90 degrees, vitals signs stable.

## 2012-11-08 ENCOUNTER — Telehealth: Payer: Self-pay | Admitting: *Deleted

## 2012-11-08 NOTE — Telephone Encounter (Signed)
  Follow up Call-  Call back number 11/07/2012  Post procedure Call Back phone  # [phone]  Permission to leave phone message Yes     Patient questions:  Do you have a fever, pain , or abdominal swelling? no Pain Score  0 *  Have you tolerated food without any problems? yes  Have you been able to return to your normal activities? yes  Do you have any questions about your discharge instructions: Diet   no Medications  no Follow up visit  no  Do you have questions or concerns about your Care? no  Actions: * If pain score is 4 or above: No action needed, pain <4.

## 2012-11-14 ENCOUNTER — Encounter: Payer: Self-pay | Admitting: Podiatry

## 2012-11-16 ENCOUNTER — Other Ambulatory Visit: Payer: Self-pay | Admitting: Podiatry

## 2012-11-17 ENCOUNTER — Telehealth: Payer: Self-pay | Admitting: Podiatry

## 2012-11-17 NOTE — Telephone Encounter (Signed)
Telephone encounter opened by error.

## 2012-11-22 ENCOUNTER — Encounter: Payer: Self-pay | Admitting: Podiatry

## 2012-11-22 ENCOUNTER — Ambulatory Visit (INDEPENDENT_AMBULATORY_CARE_PROVIDER_SITE_OTHER): Payer: 59 | Admitting: Podiatry

## 2012-11-22 VITALS — BP 117/53 | HR 72 | Wt 131.0 lb

## 2012-11-22 DIAGNOSIS — Z9889 Other specified postprocedural states: Secondary | ICD-10-CM

## 2012-11-22 MED ORDER — NABUMETONE 500 MG PO TABS: 500.0000 mg | ORAL_TABLET | Freq: Two times a day (BID) | ORAL | Status: DC

## 2012-11-22 NOTE — Progress Notes (Signed)
Subjective  Status post 11 and a half weeks osseous foot surgery post op visit. Patient is ambulatory in  Regular shoes.  Denies having fever or chill. Pain does not need any medications at this time. Patient denies having any pain.  Objective  Surgical wound well coapted. No pain upon range of motion. Last X-ray reviewed. Good approximation and incorporation of grafted bone. Assessment: Satisfactory bone healing following Cotton osteotomy. Plan: Release to return to work. Take Relafen as needed.

## 2012-11-22 NOTE — Patient Instructions (Signed)
11 and half weeks of post foot surgery.  Healing is well and is according to schedule. Ok to involve in regular activity as tolerated. May return to regular work without restrictions as tolerated. Take medications as instructed.

## 2013-02-16 ENCOUNTER — Other Ambulatory Visit: Payer: Self-pay | Admitting: Physician Assistant

## 2013-04-30 IMAGING — CT CT ENTEROGRAPHY (ABD-PELV W/ CM)
2 of 6 series · 17 of 46 positions shown, 19 images · IV contrast (omnipaque)
Comparison: CT scan 01/05/2012

CLINICAL DATA: Persistent lower abdominal pain and blood in stool.

CT ABDOMEN AND PELVIS WITH CONTRAST (CT ENTEROGRAPHY)
TECHNIQUE: Multidetector CT of the abdomen and pelvis during bolus
administration of intravenous contrast. Negative oral contrast
VoLumen was given.
Contrast: 100mL OMNIPAQUE IOHEXOL 300 MG/ML  SOLN

[Series 4: enterography standard · axial · 0.62mm/px · z∈[-418,-18]mm · 14 of 224 slices shown, 16 images]
[im 12/224  soft-tissue]
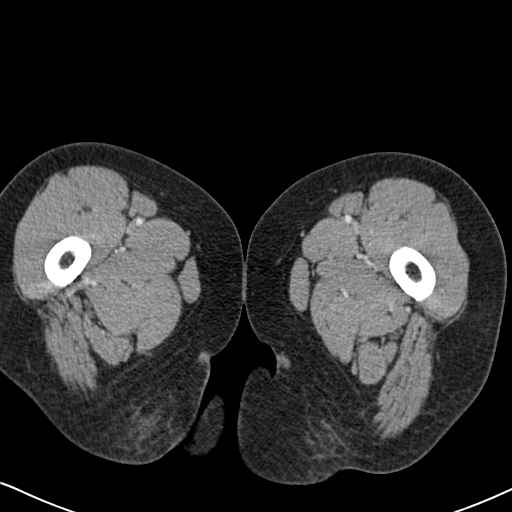
[im 12/224  bone]
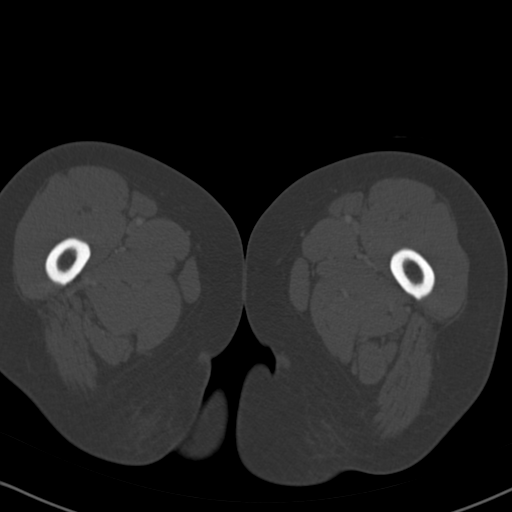
[im 24/224  soft-tissue]
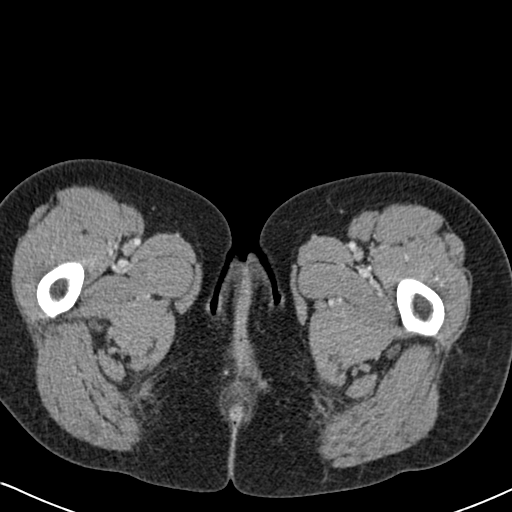
[im 47/224  soft-tissue]
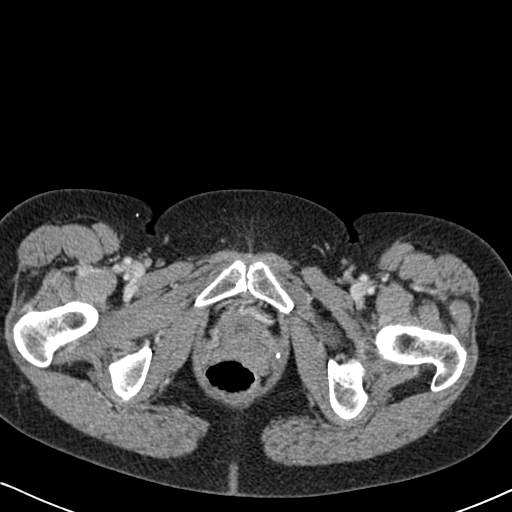
[im 59/224  soft-tissue]
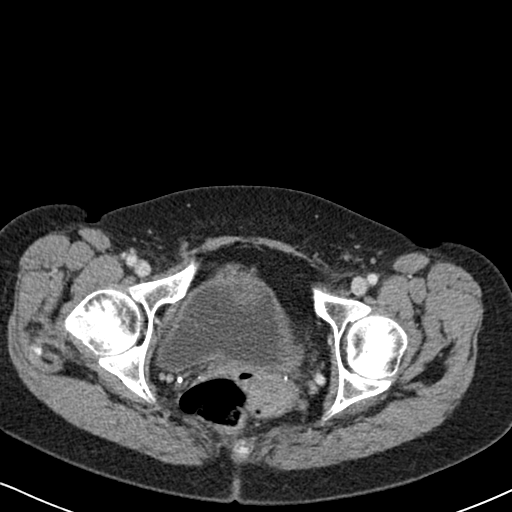
[im 71/224  soft-tissue]
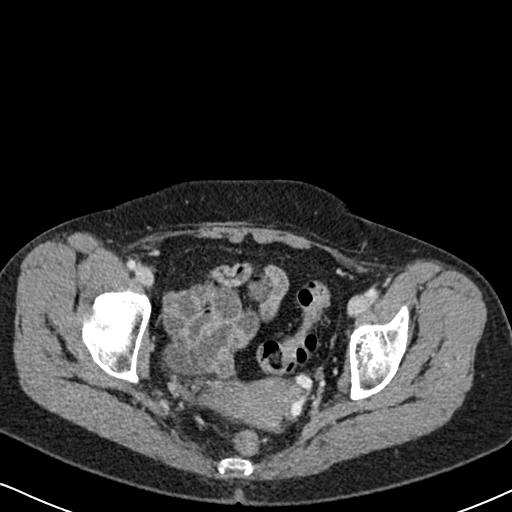
[im 94/224  soft-tissue]
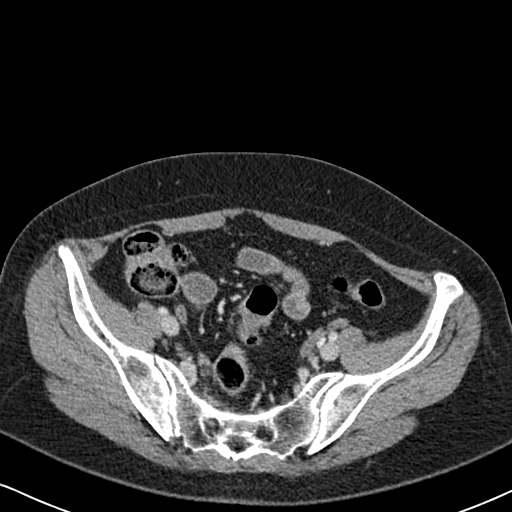
[im 106/224  soft-tissue]
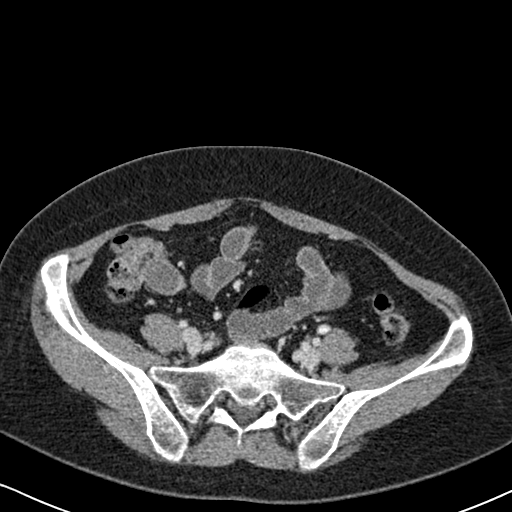
[im 118/224  soft-tissue]
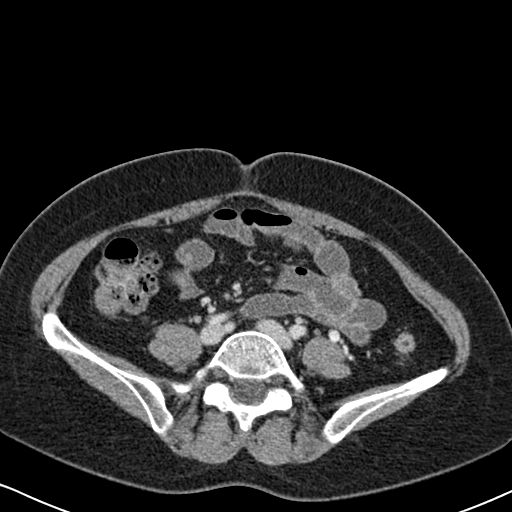
[im 130/224  soft-tissue]
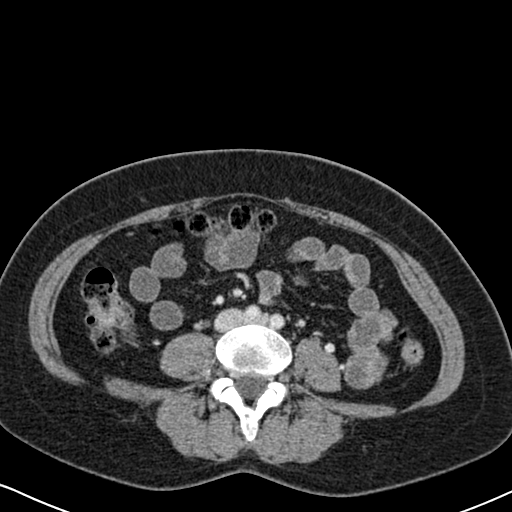
[im 130/224  bone]
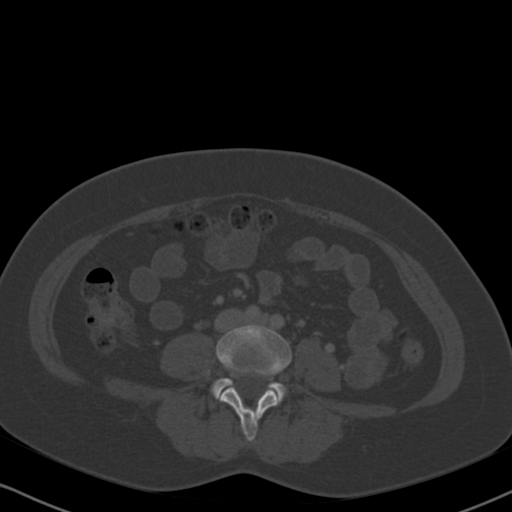
[im 153/224  soft-tissue]
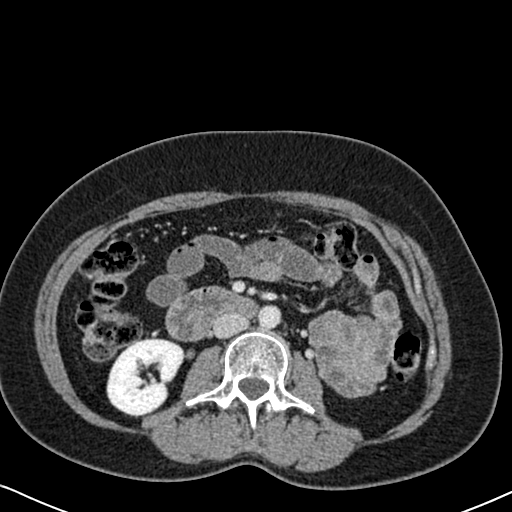
[im 165/224  soft-tissue]
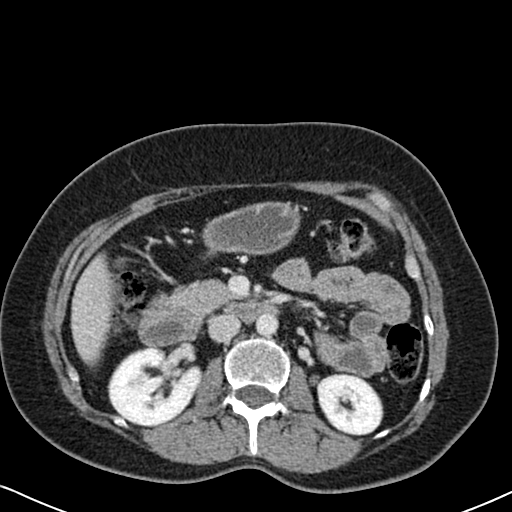
[im 177/224  soft-tissue]
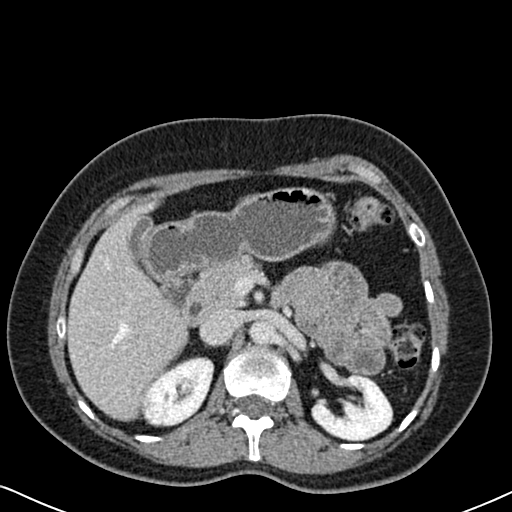
[im 200/224  soft-tissue]
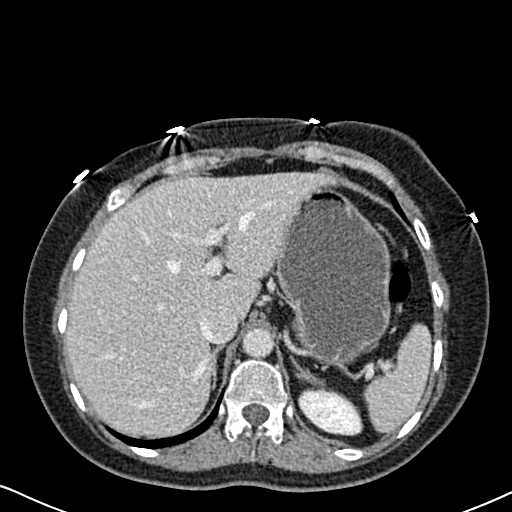
[im 212/224  soft-tissue]
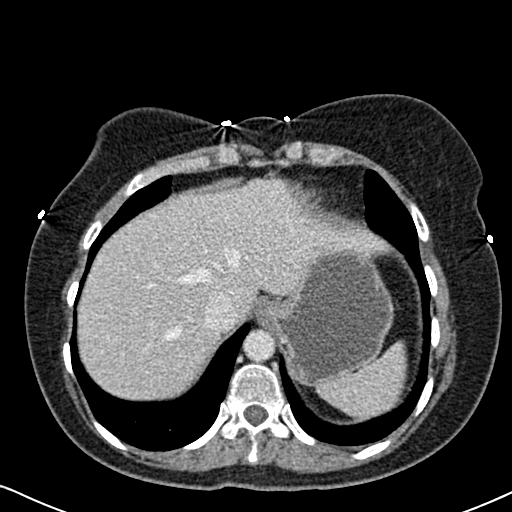

[Series 602: cor · coronal · 0.88mm/px · 3 of 98 slices shown]
[im 33/98  soft-tissue]
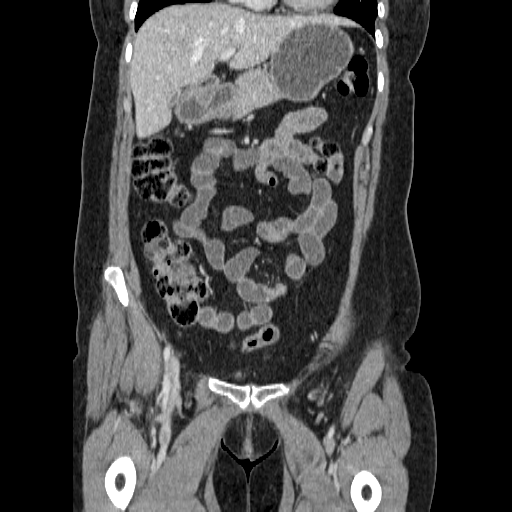
[im 44/98  soft-tissue]
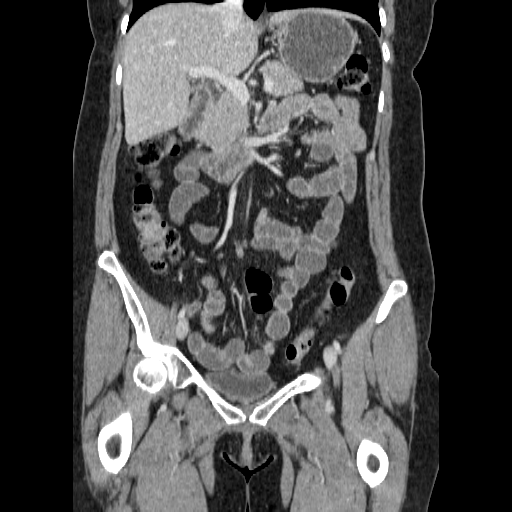
[im 54/98  soft-tissue]
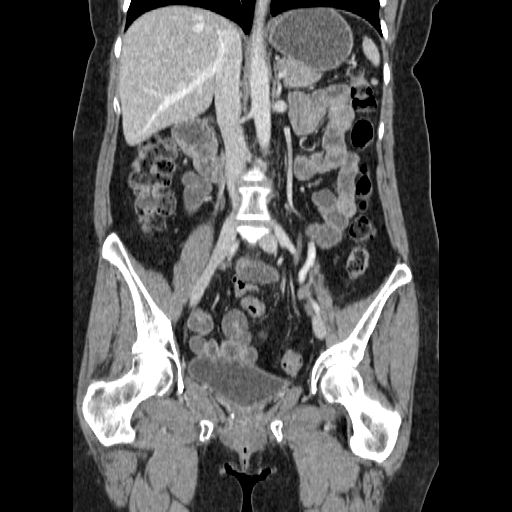

[17 of 46 positions shown; findings below may reference images not displayed]

FINDINGS: The lung bases are clear.

The solid abdominal organs are normal and stable.

The stomach and duodenum are unremarkable.  The small bowel
demonstrates normal caliber.  The dilated mid distal small bowel
loop seen on the prior CT scan and is no longer identified.  No
abnormal contrast enhancement wall thickening involving the small
bowel to suggest inflammatory bowel disease.  No mass lesions or
filling defects.  The colon is unremarkable.

No mesenteric or retroperitoneal masses or adenopathy.  The aorta
is normal in caliber.  The major branch vessels are normal.

The uterus and ovaries are unremarkable.  The bladder appears
normal.  No pelvic mass, adenopathy or free pelvic fluid
collections.  No inguinal mass or hernia.  The bony structures are
unremarkable.
IMPRESSION: Unremarkable CT examination of the abdomen/pelvis.  No findings for
small bowel obstruction or inflammatory bowel disease.

## 2013-09-04 ENCOUNTER — Other Ambulatory Visit: Payer: Self-pay | Admitting: Gastroenterology

## 2014-03-16 ENCOUNTER — Encounter: Payer: Self-pay | Admitting: Physician Assistant

## 2014-03-16 ENCOUNTER — Ambulatory Visit (INDEPENDENT_AMBULATORY_CARE_PROVIDER_SITE_OTHER): Payer: 59 | Admitting: Physician Assistant

## 2014-03-16 VITALS — BP 120/82 | HR 60 | Temp 97.9°F | Ht 59.0 in | Wt 122.1 lb

## 2014-03-16 DIAGNOSIS — R209 Unspecified disturbances of skin sensation: Secondary | ICD-10-CM

## 2014-03-16 DIAGNOSIS — Z23 Encounter for immunization: Secondary | ICD-10-CM

## 2014-03-16 DIAGNOSIS — Z0000 Encounter for general adult medical examination without abnormal findings: Secondary | ICD-10-CM

## 2014-03-16 DIAGNOSIS — K219 Gastro-esophageal reflux disease without esophagitis: Secondary | ICD-10-CM

## 2014-03-16 DIAGNOSIS — M25512 Pain in left shoulder: Secondary | ICD-10-CM

## 2014-03-16 DIAGNOSIS — B019 Varicella without complication: Secondary | ICD-10-CM | POA: Insufficient documentation

## 2014-03-16 DIAGNOSIS — R208 Other disturbances of skin sensation: Secondary | ICD-10-CM

## 2014-03-16 DIAGNOSIS — M25519 Pain in unspecified shoulder: Secondary | ICD-10-CM

## 2014-03-16 LAB — COMPLETE METABOLIC PANEL WITH GFR
ALT: 13 U/L (ref 0–35)
AST: 17 U/L (ref 0–37)
Albumin: 4.4 g/dL (ref 3.5–5.2)
Alkaline Phosphatase: 54 U/L (ref 39–117)
BILIRUBIN TOTAL: 1.3 mg/dL — AB (ref 0.2–1.2)
BUN: 15 mg/dL (ref 6–23)
CHLORIDE: 106 meq/L (ref 96–112)
CO2: 28 meq/L (ref 19–32)
CREATININE: 0.77 mg/dL (ref 0.50–1.10)
Calcium: 9.5 mg/dL (ref 8.4–10.5)
GFR, Est Non African American: 87 mL/min
Glucose, Bld: 95 mg/dL (ref 70–99)
Potassium: 4.7 mEq/L (ref 3.5–5.3)
SODIUM: 141 meq/L (ref 135–145)
Total Protein: 7.1 g/dL (ref 6.0–8.3)

## 2014-03-16 LAB — LIPID PANEL
CHOLESTEROL: 161 mg/dL (ref 0–200)
HDL: 59 mg/dL (ref >39)
LDL Cholesterol: 86 mg/dL (ref 0–99)
TRIGLYCERIDES: 80 mg/dL (ref <150)
Total CHOL/HDL Ratio: 2.7 Ratio
VLDL: 16 mg/dL (ref 0–40)

## 2014-03-16 LAB — CBC
HCT: 39.4 % (ref 36.0–46.0)
Hemoglobin: 13.7 g/dL (ref 12.0–15.0)
MCH: 29 pg (ref 26.0–34.0)
MCHC: 34.8 g/dL (ref 30.0–36.0)
MCV: 83.5 fL (ref 78.0–100.0)
Platelets: 273 10*3/uL (ref 150–400)
RBC: 4.72 MIL/uL (ref 3.87–5.11)
RDW: 14.1 % (ref 11.5–15.5)
WBC: 5 10*3/uL (ref 4.0–10.5)

## 2014-03-16 LAB — HEMOGLOBIN A1C
HEMOGLOBIN A1C: 5.7 % — AB (ref <5.7)
MEAN PLASMA GLUCOSE: 117 mg/dL — AB (ref <117)

## 2014-03-16 LAB — VITAMIN B12: VITAMIN B 12: 470 pg/mL (ref 211–911)

## 2014-03-16 LAB — TSH: TSH: 2.212 u[IU]/mL (ref 0.350–4.500)

## 2014-03-16 MED ORDER — DEXLANSOPRAZOLE 60 MG PO CPDR: DELAYED_RELEASE_CAPSULE | ORAL | Status: DC

## 2014-03-16 NOTE — Progress Notes (Addendum)
Patient presents to clinic today to establish care.  Acute Concerns: Patient endorses some decrease in ROM of left arm with overhead motion of arm.  States it is sometimes hard for her to hold a coffee mug for long, before her arm feels weak and she has to put it down.  Denies numbness or tingling.  Occasional pain with overhead motion.  Denies trauma or injury to shoulder.  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  Patient has an orthopedist but has not scheduled an appointment with him yet.  Patient also endorses occasional burning sensation in lower legs bilaterally. Symptoms come and go. No known exacerbating or alleviating factors. Patient denies hx of diabetes.  Is a non-smoker.  Denies hx of vitamin deficiency.   Chronic Issues: GERD -- Patient endorses significant GERD symptoms including reflux and globus. Denies epigastric pain or chronic cough. Has occasional nausea with symptoms.  Denies history of H. Pylori.  Has been on dexilant in the past with good relief of symptoms.  Needs refill of medication.  Health Maintenance: Dental -- Up-to-date Vision -- Up-to-date Immunizations -- Tetanus overdue.  Colonoscopy -- Had colonoscopy in 2011.  No abnormal findings.  Due in 2021. Mammogram -- Followed by OB/GYN.  Has scheduled for September. PAP -- Yearly by OB/GYN.  No hx abnormal PAP.  Patient s/pTAH. Bone Density -- Due for bone density.  Last 2 years ago.  Past Medical History  Diagnosis Date  . Acid reflux   . Chicken pox as a child    Past Surgical History  Procedure Laterality Date  . Abdominal hysterectomy  1992    full  . Colonoscopy  01-02-2010    Dr.   . Lenore Cordia surgery - cotton, gastroc Left 09/01/2012    1st ray elevatus, Equinus Lt    Current Outpatient Prescriptions on File Prior to Visit  Medication Sig Dispense Refill  . ibuprofen (ADVIL,MOTRIN) 200 MG tablet Take 20-800 mg by mouth every 6 (six) hours as  needed. As needed for pain.       No current facility-administered medications on file prior to visit.    Allergies  Allergen Reactions  . Pollen Extract Other (See Comments)    Nasal congestion.     Family History  Problem Relation Age of Onset  . Colon cancer Neg Hx   . Alzheimer's disease Mother     History   Social History  . Marital Status: Widowed    Spouse Name: N/A    Number of Children: 2  . Years of Education: N/A   Occupational History  . nurse tech [employer]   Social History Main Topics  . Smoking status: Never Smoker   . Smokeless tobacco: Never Used  . Alcohol Use: Yes     Comment: rare  . Drug Use: No  . Sexual Activity: Not on file   Other Topics Concern  . Not on file   Social History Narrative  . No narrative on file   Review of Systems  Constitutional: Negative for fever and weight loss.  HENT: Negative for ear discharge, ear pain, hearing loss and tinnitus.   Eyes: Negative for blurred vision, double vision, photophobia and pain.  Respiratory: Negative for cough and shortness of breath.   Cardiovascular: Negative for chest pain and palpitations.  Gastrointestinal: Positive for heartburn. Negative for nausea, vomiting, abdominal pain, diarrhea, constipation, blood in stool and melena.  Genitourinary: Negative for dysuria, urgency,  frequency, hematuria and flank pain.  Neurological: Negative for dizziness, loss of consciousness and headaches.  Endo/Heme/Allergies: Positive for environmental allergies.  Psychiatric/Behavioral: Negative for depression, suicidal ideas, hallucinations and substance abuse. The patient is nervous/anxious.    BP 120/82  Pulse 60  Temp(Src) 97.9 F (36.6 C) (Oral)  Ht 4\' 11"  (1.499 m)  Wt 122 lb 1.9 oz (55.393 kg)  BMI 24.65 kg/m2  SpO2 97%  Physical Exam  Vitals reviewed. Constitutional: She is oriented to person, place, and time and well-developed, well-nourished, and in no distress.  HENT:  Head:  Normocephalic and atraumatic.  Right Ear: External ear normal.  Left Ear: External ear normal.  Nose: Nose normal.  Mouth/Throat: Oropharynx is clear and moist. No oropharyngeal exudate.  TM within normal limits bilaterally.  Eyes: Conjunctivae and EOM are normal. Pupils are equal, round, and reactive to light.  Neck: Neck supple. No thyromegaly present.  Cardiovascular: Normal rate, regular rhythm, normal heart sounds and intact distal pulses.   Pulmonary/Chest: Effort normal and breath sounds normal. No respiratory distress. She has no wheezes. She has no rales. She exhibits no tenderness.  Abdominal: Soft. Bowel sounds are normal. She exhibits no distension and no mass. There is no tenderness. There is no rebound and no guarding.  Musculoskeletal:       Left shoulder: She exhibits pain and decreased strength. She exhibits normal range of motion.       Right knee: Normal.       Left knee: Normal.       Right ankle: Normal.       Left ankle: Normal.       Right lower leg: Normal.       Left lower leg: Normal.  Lymphadenopathy:    She has no cervical adenopathy.  Neurological: She is alert and oriented to person, place, and time. She has normal reflexes.  Skin: Skin is warm and dry. No rash noted.  Psychiatric: Affect and judgment normal.   Assessment/Plan: Visit for preventive health examination Health Maintenance reviewed.  Tetanus updated. Followed by OB/GYN for mammogram and routine gynecological examination.  Has upcoming appointment in September. Will obtain fasting labs.  Burning sensation in lower extremity Physical exam unremarkable.   Will obtain labs to include BMP and B12 levels.    Need for prophylactic vaccination with tetanus-diphtheria (TD) Immunization given by nursing staff.  Left shoulder pain Some mildly decreased strength when compared to contralateral extremity. Discussed imaging.  Patient defers to her Orthopedist.  Avoid heavy lifting or overexertion.   Topical Aspercreme to LUE.  Tylenol or Ibuprofen if needed for pain. Patient to schedule an appointment with her specialist.  GERD (gastroesophageal reflux disease) Dexilant refilled.  Avoid trigger foods and late-night eating.  Elevate HOB.

## 2014-03-16 NOTE — Patient Instructions (Signed)
Please obtain labs.  I will call you with your results.  Please call your Orthopedist for an appointment.  Continue the Dexilant for your acid reflux.  Start a daily probiotic (Align, Digestive Advantage, Culturelle, Activia).  You will be contacted for a Bone Density scan.  Please follow-up with your gynecologist as directed for yearly exam and mammogram.  We will schedule follow-up based on your lab results.  Preventive Care for Adults A healthy lifestyle and preventive care can promote health and wellness. Preventive health guidelines for women include the following key practices.  A routine yearly physical is a good way to check with your health care provider about your health and preventive screening. It is a chance to share any concerns and updates on your health and to receive a thorough exam.  Visit your dentist for a routine exam and preventive care every 6 months. Brush your teeth twice a day and floss once a day. Good oral hygiene prevents tooth decay and gum disease.  The frequency of eye exams is based on your age, health, family medical history, use of contact lenses, and other factors. Follow your health care provider's recommendations for frequency of eye exams.  Eat a healthy diet. Foods like vegetables, fruits, whole grains, low-fat dairy products, and lean protein foods contain the nutrients you need without too many calories. Decrease your intake of foods high in solid fats, added sugars, and salt. Eat the right amount of calories for you.Get information about a proper diet from your health care provider, if necessary.  Regular physical exercise is one of the most important things you can do for your health. Most adults should get at least 150 minutes of moderate-intensity exercise (any activity that increases your heart rate and causes you to sweat) each week. In addition, most adults need muscle-strengthening exercises on 2 or more days a week.  Maintain a healthy weight. The  body mass index (BMI) is a screening tool to identify possible weight problems. It provides an estimate of body fat based on height and weight. Your health care provider can find your BMI, and can help you achieve or maintain a healthy weight.For adults 20 years and older:  A BMI below 18.5 is considered underweight.  A BMI of 18.5 to 24.9 is normal.  A BMI of 25 to 29.9 is considered overweight.  A BMI of 30 and above is considered obese.  Maintain normal blood lipids and cholesterol levels by exercising and minimizing your intake of saturated fat. Eat a balanced diet with plenty of fruit and vegetables. Blood tests for lipids and cholesterol should begin at age 48 and be repeated every 5 years. If your lipid or cholesterol levels are high, you are over 50, or you are at high risk for heart disease, you may need your cholesterol levels checked more frequently.Ongoing high lipid and cholesterol levels should be treated with medicines if diet and exercise are not working.  If you smoke, find out from your health care provider how to quit. If you do not use tobacco, do not start.  Lung cancer screening is recommended for adults aged 26-80 years who are at high risk for developing lung cancer because of a history of smoking. A yearly low-dose CT scan of the lungs is recommended for people who have at least a 30-pack-year history of smoking and are a current smoker or have quit within the past 15 years. A pack year of smoking is smoking an average of 1 pack of cigarettes  a day for 1 year (for example: 1 pack a day for 30 years or 2 packs a day for 15 years). Yearly screening should continue until the smoker has stopped smoking for at least 15 years. Yearly screening should be stopped for people who develop a health problem that would prevent them from having lung cancer treatment.  If you are pregnant, do not drink alcohol. If you are breastfeeding, be very cautious about drinking alcohol. If you are  not pregnant and choose to drink alcohol, do not have more than 1 drink per day. One drink is considered to be 12 ounces (355 mL) of beer, 5 ounces (148 mL) of wine, or 1.5 ounces (44 mL) of liquor.  Avoid use of street drugs. Do not share needles with anyone. Ask for help if you need support or instructions about stopping the use of drugs.  High blood pressure causes heart disease and increases the risk of stroke. Your blood pressure should be checked at least every 1 to 2 years. Ongoing high blood pressure should be treated with medicines if weight loss and exercise do not work.  If you are 25-20 years old, ask your health care provider if you should take aspirin to prevent strokes.  Diabetes screening involves taking a blood sample to check your fasting blood sugar level. This should be done once every 3 years, after age 6, if you are within normal weight and without risk factors for diabetes. Testing should be considered at a younger age or be carried out more frequently if you are overweight and have at least 1 risk factor for diabetes.  Breast cancer screening is essential preventive care for women. You should practice "breast self-awareness." This means understanding the normal appearance and feel of your breasts and may include breast self-examination. Any changes detected, no matter how small, should be reported to a health care provider. Women in their 37s and 30s should have a clinical breast exam (CBE) by a health care provider as part of a regular health exam every 1 to 3 years. After age 35, women should have a CBE every year. Starting at age 30, women should consider having a mammogram (breast X-ray test) every year. Women who have a family history of breast cancer should talk to their health care provider about genetic screening. Women at a high risk of breast cancer should talk to their health care providers about having an MRI and a mammogram every year.  Breast cancer gene  (BRCA)-related cancer risk assessment is recommended for women who have family members with BRCA-related cancers. BRCA-related cancers include breast, ovarian, tubal, and peritoneal cancers. Having family members with these cancers may be associated with an increased risk for harmful changes (mutations) in the breast cancer genes BRCA1 and BRCA2. Results of the assessment will determine the need for genetic counseling and BRCA1 and BRCA2 testing.  Routine pelvic exams to screen for cancer are no longer recommended for nonpregnant women who are considered low risk for cancer of the pelvic organs (ovaries, uterus, and vagina) and who do not have symptoms. Ask your health care provider if a screening pelvic exam is right for you.  If you have had past treatment for cervical cancer or a condition that could lead to cancer, you need Pap tests and screening for cancer for at least 20 years after your treatment. If Pap tests have been discontinued, your risk factors (such as having a new sexual partner) need to be reassessed to determine if screening should  be resumed. Some women have medical problems that increase the chance of getting cervical cancer. In these cases, your health care provider may recommend more frequent screening and Pap tests.  The HPV test is an additional test that may be used for cervical cancer screening. The HPV test looks for the virus that can cause the cell changes on the cervix. The cells collected during the Pap test can be tested for HPV. The HPV test could be used to screen women aged 5 years and older, and should be used in women of any age who have unclear Pap test results. After the age of 76, women should have HPV testing at the same frequency as a Pap test.  Colorectal cancer can be detected and often prevented. Most routine colorectal cancer screening begins at the age of 16 years and continues through age 7 years. However, your health care provider may recommend screening at  an earlier age if you have risk factors for colon cancer. On a yearly basis, your health care provider may provide home test kits to check for hidden blood in the stool. Use of a small camera at the end of a tube, to directly examine the colon (sigmoidoscopy or colonoscopy), can detect the earliest forms of colorectal cancer. Talk to your health care provider about this at age 65, when routine screening begins. Direct exam of the colon should be repeated every 5-10 years through age 13 years, unless early forms of pre-cancerous polyps or small growths are found.  People who are at an increased risk for hepatitis B should be screened for this virus. You are considered at high risk for hepatitis B if:  You were born in a country where hepatitis B occurs often. Talk with your health care provider about which countries are considered high risk.  Your parents were born in a high-risk country and you have not received a shot to protect against hepatitis B (hepatitis B vaccine).  You have HIV or AIDS.  You use needles to inject street drugs.  You live with, or have sex with, someone who has Hepatitis B.  You get hemodialysis treatment.  You take certain medicines for conditions like cancer, organ transplantation, and autoimmune conditions.  Hepatitis C blood testing is recommended for all people born from [birth year] through 1965 and any individual with known risks for hepatitis C.  Practice safe sex. Use condoms and avoid high-risk sexual practices to reduce the spread of sexually transmitted infections (STIs). STIs include gonorrhea, chlamydia, syphilis, trichomonas, herpes, HPV, and human immunodeficiency virus (HIV). Herpes, HIV, and HPV are viral illnesses that have no cure. They can result in disability, cancer, and death.  You should be screened for sexually transmitted illnesses (STIs) including gonorrhea and chlamydia if:  You are sexually active and are younger than 24 years.  You are older  than 24 years and your health care provider tells you that you are at risk for this type of infection.  Your sexual activity has changed since you were last screened and you are at an increased risk for chlamydia or gonorrhea. Ask your health care provider if you are at risk.  If you are at risk of being infected with HIV, it is recommended that you take a prescription medicine daily to prevent HIV infection. This is called preexposure prophylaxis (PrEP). You are considered at risk if:  You are a heterosexual woman, are sexually active, and are at increased risk for HIV infection.  You take drugs by injection.  You are sexually active with a partner who has HIV.  Talk with your health care provider about whether you are at high risk of being infected with HIV. If you choose to begin PrEP, you should first be tested for HIV. You should then be tested every 3 months for as long as you are taking PrEP.  Osteoporosis is a disease in which the bones lose minerals and strength with aging. This can result in serious bone fractures or breaks. The risk of osteoporosis can be identified using a bone density scan. Women ages 85 years and over and women at risk for fractures or osteoporosis should discuss screening with their health care providers. Ask your health care provider whether you should take a calcium supplement or vitamin D to reduce the rate of osteoporosis.  Menopause can be associated with physical symptoms and risks. Hormone replacement therapy is available to decrease symptoms and risks. You should talk to your health care provider about whether hormone replacement therapy is right for you.  Use sunscreen. Apply sunscreen liberally and repeatedly throughout the day. You should seek shade when your shadow is shorter than you. Protect yourself by wearing long sleeves, pants, a wide-brimmed hat, and sunglasses year round, whenever you are outdoors.  Once a month, do a whole body skin exam, using  a mirror to look at the skin on your back. Tell your health care provider of new moles, moles that have irregular borders, moles that are larger than a pencil eraser, or moles that have changed in shape or color.  Stay current with required vaccines (immunizations).  Influenza vaccine. All adults should be immunized every year.  Tetanus, diphtheria, and acellular pertussis (Td, Tdap) vaccine. Pregnant women should receive 1 dose of Tdap vaccine during each pregnancy. The dose should be obtained regardless of the length of time since the last dose. Immunization is preferred during the 27th-36th week of gestation. An adult who has not previously received Tdap or who does not know her vaccine status should receive 1 dose of Tdap. This initial dose should be followed by tetanus and diphtheria toxoids (Td) booster doses every 10 years. Adults with an unknown or incomplete history of completing a 3-dose immunization series with Td-containing vaccines should begin or complete a primary immunization series including a Tdap dose. Adults should receive a Td booster every 10 years.  Varicella vaccine. An adult without evidence of immunity to varicella should receive 2 doses or a second dose if she has previously received 1 dose. Pregnant females who do not have evidence of immunity should receive the first dose after pregnancy. This first dose should be obtained before leaving the health care facility. The second dose should be obtained 4-8 weeks after the first dose.  Human papillomavirus (HPV) vaccine. Females aged 13-26 years who have not received the vaccine previously should obtain the 3-dose series. The vaccine is not recommended for use in pregnant females. However, pregnancy testing is not needed before receiving a dose. If a female is found to be pregnant after receiving a dose, no treatment is needed. In that case, the remaining doses should be delayed until after the pregnancy. Immunization is recommended  for any person with an immunocompromised condition through the age of 26 years if she did not get any or all doses earlier. During the 3-dose series, the second dose should be obtained 4-8 weeks after the first dose. The third dose should be obtained 24 weeks after the first dose and 16 weeks after the  second dose.  Zoster vaccine. One dose is recommended for adults aged 70 years or older unless certain conditions are present.  Measles, mumps, and rubella (MMR) vaccine. Adults born before [birth year] generally are considered immune to measles and mumps. Adults born in [birth year] or later should have 1 or more doses of MMR vaccine unless there is a contraindication to the vaccine or there is laboratory evidence of immunity to each of the three diseases. A routine second dose of MMR vaccine should be obtained at least 28 days after the first dose for students attending postsecondary schools, health care workers, or international travelers. People who received inactivated measles vaccine or an unknown type of measles vaccine during 1963-1967 should receive 2 doses of MMR vaccine. People who received inactivated mumps vaccine or an unknown type of mumps vaccine before 1979 and are at high risk for mumps infection should consider immunization with 2 doses of MMR vaccine. For females of childbearing age, rubella immunity should be determined. If there is no evidence of immunity, females who are not pregnant should be vaccinated. If there is no evidence of immunity, females who are pregnant should delay immunization until after pregnancy. Unvaccinated health care workers born before [birth year] who lack laboratory evidence of measles, mumps, or rubella immunity or laboratory confirmation of disease should consider measles and mumps immunization with 2 doses of MMR vaccine or rubella immunization with 1 dose of MMR vaccine.  Pneumococcal 13-valent conjugate (PCV13) vaccine. When indicated, a person who is uncertain of her immunization  history and has no record of immunization should receive the PCV13 vaccine. An adult aged 20 years or older who has certain medical conditions and has not been previously immunized should receive 1 dose of PCV13 vaccine. This PCV13 should be followed with a dose of pneumococcal polysaccharide (PPSV23) vaccine. The PPSV23 vaccine dose should be obtained at least 8 weeks after the dose of PCV13 vaccine. An adult aged 84 years or older who has certain medical conditions and previously received 1 or more doses of PPSV23 vaccine should receive 1 dose of PCV13. The PCV13 vaccine dose should be obtained 1 or more years after the last PPSV23 vaccine dose.  Pneumococcal polysaccharide (PPSV23) vaccine. When PCV13 is also indicated, PCV13 should be obtained first. All adults aged 71 years and older should be immunized. An adult younger than age 25 years who has certain medical conditions should be immunized. Any person who resides in a nursing home or long-term care facility should be immunized. An adult smoker should be immunized. People with an immunocompromised condition and certain other conditions should receive both PCV13 and PPSV23 vaccines. People with human immunodeficiency virus (HIV) infection should be immunized as soon as possible after diagnosis. Immunization during chemotherapy or radiation therapy should be avoided. Routine use of PPSV23 vaccine is not recommended for American Indians, [place] Natives, or people younger than 65 years unless there are medical conditions that require PPSV23 vaccine. When indicated, people who have unknown immunization and have no record of immunization should receive PPSV23 vaccine. One-time revaccination 5 years after the first dose of PPSV23 is recommended for people aged 19-64 years who have chronic kidney failure, nephrotic syndrome, asplenia, or immunocompromised conditions. People who received 1-2 doses of PPSV23 before age 32 years should receive another dose of PPSV23  vaccine at age 84 years or later if at least 5 years have passed since the previous dose. Doses of PPSV23 are not needed for people immunized with PPSV23 at or after age 67  years.  Meningococcal vaccine. Adults with asplenia or persistent complement component deficiencies should receive 2 doses of quadrivalent meningococcal conjugate (MenACWY-D) vaccine. The doses should be obtained at least 2 months apart. Microbiologists working with certain meningococcal bacteria, [place] recruits, people at risk during an outbreak, and people who travel to or live in countries with a high rate of meningitis should be immunized. A first-year college student up through age 66 years who is living in a residence hall should receive a dose if she did not receive a dose on or after her 16th birthday. Adults who have certain high-risk conditions should receive one or more doses of vaccine.  Hepatitis A vaccine. Adults who wish to be protected from this disease, have certain high-risk conditions, work with hepatitis A-infected animals, work in hepatitis A research labs, or travel to or work in countries with a high rate of hepatitis A should be immunized. Adults who were previously unvaccinated and who anticipate close contact with an international adoptee during the first 60 days after arrival in the [place] from a country with a high rate of hepatitis A should be immunized.  Hepatitis B vaccine. Adults who wish to be protected from this disease, have certain high-risk conditions, may be exposed to blood or other infectious body fluids, are household contacts or sex partners of hepatitis B positive people, are clients or workers in certain care facilities, or travel to or work in countries with a high rate of hepatitis B should be immunized.  Haemophilus influenzae type b (Hib) vaccine. A previously unvaccinated person with asplenia or sickle cell disease or having a scheduled splenectomy should receive 1 dose of Hib  vaccine. Regardless of previous immunization, a recipient of a hematopoietic stem cell transplant should receive a 3-dose series 6-12 months after her successful transplant. Hib vaccine is not recommended for adults with HIV infection. Preventive Services / Frequency Ages 76 to 39years  Blood pressure check.** / Every 1 to 2 years.  Lipid and cholesterol check.** / Every 5 years beginning at age 58.  Clinical breast exam.** / Every 3 years for women in their 12s and 19s.  BRCA-related cancer risk assessment.** / For women who have family members with a BRCA-related cancer (breast, ovarian, tubal, or peritoneal cancers).  Pap test.** / Every 2 years from ages 5 through 12. Every 3 years starting at age 39 through age 97 or 62 with a history of 3 consecutive normal Pap tests.  HPV screening.** / Every 3 years from ages 48 through ages 40 to 42 with a history of 3 consecutive normal Pap tests.  Hepatitis C blood test.** / For any individual with known risks for hepatitis C.  Skin self-exam. / Monthly.  Influenza vaccine. / Every year.  Tetanus, diphtheria, and acellular pertussis (Tdap, Td) vaccine.** / Consult your health care provider. Pregnant women should receive 1 dose of Tdap vaccine during each pregnancy. 1 dose of Td every 10 years.  Varicella vaccine.** / Consult your health care provider. Pregnant females who do not have evidence of immunity should receive the first dose after pregnancy.  HPV vaccine. / 3 doses over 6 months, if 90 and younger. The vaccine is not recommended for use in pregnant females. However, pregnancy testing is not needed before receiving a dose.  Measles, mumps, rubella (MMR) vaccine.** / You need at least 1 dose of MMR if you were born in 1957 or later. You may also need a 2nd dose. For females of childbearing age, rubella immunity  should be determined. If there is no evidence of immunity, females who are not pregnant should be vaccinated. If there is no  evidence of immunity, females who are pregnant should delay immunization until after pregnancy.  Pneumococcal 13-valent conjugate (PCV13) vaccine.** / Consult your health care provider.  Pneumococcal polysaccharide (PPSV23) vaccine.** / 1 to 2 doses if you smoke cigarettes or if you have certain conditions.  Meningococcal vaccine.** / 1 dose if you are age 70 to 66 years and a Market researcher living in a residence hall, or have one of several medical conditions, you need to get vaccinated against meningococcal disease. You may also need additional booster doses.  Hepatitis A vaccine.** / Consult your health care provider.  Hepatitis B vaccine.** / Consult your health care provider.  Haemophilus influenzae type b (Hib) vaccine.** / Consult your health care provider. Ages 49 to 64years  Blood pressure check.** / Every 1 to 2 years.  Lipid and cholesterol check.** / Every 5 years beginning at age 23 years.  Lung cancer screening. / Every year if you are aged 22-80 years and have a 30-pack-year history of smoking and currently smoke or have quit within the past 15 years. Yearly screening is stopped once you have quit smoking for at least 15 years or develop a health problem that would prevent you from having lung cancer treatment.  Clinical breast exam.** / Every year after age 27 years.  BRCA-related cancer risk assessment.** / For women who have family members with a BRCA-related cancer (breast, ovarian, tubal, or peritoneal cancers).  Mammogram.** / Every year beginning at age 22 years and continuing for as long as you are in good health. Consult with your health care provider.  Pap test.** / Every 3 years starting at age 70 years through age 87 or 52 years with a history of 3 consecutive normal Pap tests.  HPV screening.** / Every 3 years from ages 70 years through ages 56 to 75 years with a history of 3 consecutive normal Pap tests.  Fecal occult blood test (FOBT) of  stool. / Every year beginning at age 14 years and continuing until age 39 years. You may not need to do this test if you get a colonoscopy every 10 years.  Flexible sigmoidoscopy or colonoscopy.** / Every 5 years for a flexible sigmoidoscopy or every 10 years for a colonoscopy beginning at age 53 years and continuing until age 36 years.  Hepatitis C blood test.** / For all people born from [birth year] through 1963 and any individual with known risks for hepatitis C.  Skin self-exam. / Monthly.  Influenza vaccine. / Every year.  Tetanus, diphtheria, and acellular pertussis (Tdap/Td) vaccine.** / Consult your health care provider. Pregnant women should receive 1 dose of Tdap vaccine during each pregnancy. 1 dose of Td every 10 years.  Varicella vaccine.** / Consult your health care provider. Pregnant females who do not have evidence of immunity should receive the first dose after pregnancy.  Zoster vaccine.** / 1 dose for adults aged 21 years or older.  Measles, mumps, rubella (MMR) vaccine.** / You need at least 1 dose of MMR if you were born in 1957 or later. You may also need a 2nd dose. For females of childbearing age, rubella immunity should be determined. If there is no evidence of immunity, females who are not pregnant should be vaccinated. If there is no evidence of immunity, females who are pregnant should delay immunization until after pregnancy.  Pneumococcal 13-valent conjugate (PCV13) vaccine.** /  Consult your health care provider.  Pneumococcal polysaccharide (PPSV23) vaccine.** / 1 to 2 doses if you smoke cigarettes or if you have certain conditions.  Meningococcal vaccine.** / Consult your health care provider.  Hepatitis A vaccine.** / Consult your health care provider.  Hepatitis B vaccine.** / Consult your health care provider.  Haemophilus influenzae type b (Hib) vaccine.** / Consult your health care provider. Ages 65 years and over  Blood pressure check.** / Every 1 to 2  years.  Lipid and cholesterol check.** / Every 5 years beginning at age 70 years.  Lung cancer screening. / Every year if you are aged 55-80 years and have a 30-pack-year history of smoking and currently smoke or have quit within the past 15 years. Yearly screening is stopped once you have quit smoking for at least 15 years or develop a health problem that would prevent you from having lung cancer treatment.  Clinical breast exam.** / Every year after age 69 years.  BRCA-related cancer risk assessment.** / For women who have family members with a BRCA-related cancer (breast, ovarian, tubal, or peritoneal cancers).  Mammogram.** / Every year beginning at age 40 years and continuing for as long as you are in good health. Consult with your health care provider.  Pap test.** / Every 3 years starting at age 4 years through age 31 or 34 years with 3 consecutive normal Pap tests. Testing can be stopped between 65 and 70 years with 3 consecutive normal Pap tests and no abnormal Pap or HPV tests in the past 10 years.  HPV screening.** / Every 3 years from ages 31 years through ages 28 or 54 years with a history of 3 consecutive normal Pap tests. Testing can be stopped between 65 and 70 years with 3 consecutive normal Pap tests and no abnormal Pap or HPV tests in the past 10 years.  Fecal occult blood test (FOBT) of stool. / Every year beginning at age 49 years and continuing until age 94 years. You may not need to do this test if you get a colonoscopy every 10 years.  Flexible sigmoidoscopy or colonoscopy.** / Every 5 years for a flexible sigmoidoscopy or every 10 years for a colonoscopy beginning at age 73 years and continuing until age 32 years.  Hepatitis C blood test.** / For all people born from [birth year] through 1963 and any individual with known risks for hepatitis C.  Osteoporosis screening.** / A one-time screening for women ages 58 years and over and women at risk for fractures or  osteoporosis.  Skin self-exam. / Monthly.  Influenza vaccine. / Every year.  Tetanus, diphtheria, and acellular pertussis (Tdap/Td) vaccine.** / 1 dose of Td every 10 years.  Varicella vaccine.** / Consult your health care provider.  Zoster vaccine.** / 1 dose for adults aged 73 years or older.  Pneumococcal 13-valent conjugate (PCV13) vaccine.** / Consult your health care provider.  Pneumococcal polysaccharide (PPSV23) vaccine.** / 1 dose for all adults aged 62 years and older.  Meningococcal vaccine.** / Consult your health care provider.  Hepatitis A vaccine.** / Consult your health care provider.  Hepatitis B vaccine.** / Consult your health care provider.  Haemophilus influenzae type b (Hib) vaccine.** / Consult your health care provider. ** Family history and personal history of risk and conditions may change your health care provider's recommendations. Document Released: 10/20/2001 Document Revised: 08/29/2013 Document Reviewed: 01/19/2011 [facility] Patient Information 2015 [employer], [place]. This information is not intended to replace advice given to you by your health  care provider. Make sure you discuss any questions you have with your health care provider.

## 2014-03-17 LAB — URINALYSIS, ROUTINE W REFLEX MICROSCOPIC
Bilirubin Urine: NEGATIVE
Glucose, UA: NEGATIVE mg/dL
Hgb urine dipstick: NEGATIVE
Ketones, ur: NEGATIVE mg/dL
LEUKOCYTES UA: NEGATIVE
NITRITE: NEGATIVE
PH: 6 (ref 5.0–8.0)
Protein, ur: NEGATIVE mg/dL
SPECIFIC GRAVITY, URINE: 1.019 (ref 1.005–1.030)
UROBILINOGEN UA: 0.2 mg/dL (ref 0.0–1.0)

## 2014-03-21 LAB — VITAMIN D 1,25 DIHYDROXY
VITAMIN D2 1, 25 (OH): 24 pg/mL
Vitamin D 1, 25 (OH)2 Total: 68 pg/mL (ref 18–72)
Vitamin D3 1, 25 (OH)2: 44 pg/mL

## 2014-03-22 ENCOUNTER — Ambulatory Visit (INDEPENDENT_AMBULATORY_CARE_PROVIDER_SITE_OTHER)
Admission: RE | Admit: 2014-03-22 | Discharge: 2014-03-22 | Disposition: A | Discharge status: Home / Self Care | Payer: 59 | Source: Ambulatory Visit | Attending: Physician Assistant | Admitting: Physician Assistant

## 2014-03-22 DIAGNOSIS — Z0000 Encounter for general adult medical examination without abnormal findings: Secondary | ICD-10-CM

## 2014-03-23 DIAGNOSIS — Z23 Encounter for immunization: Secondary | ICD-10-CM | POA: Insufficient documentation

## 2014-03-23 DIAGNOSIS — Z0000 Encounter for general adult medical examination without abnormal findings: Secondary | ICD-10-CM | POA: Insufficient documentation

## 2014-03-23 DIAGNOSIS — M25512 Pain in left shoulder: Secondary | ICD-10-CM | POA: Insufficient documentation

## 2014-03-23 DIAGNOSIS — G609 Hereditary and idiopathic neuropathy, unspecified: Secondary | ICD-10-CM | POA: Insufficient documentation

## 2014-03-23 NOTE — Assessment & Plan Note (Signed)
Immunization given by nursing staff. 

## 2014-03-23 NOTE — Assessment & Plan Note (Signed)
Physical exam unremarkable.   Will obtain labs to include BMP and B12 levels.

## 2014-03-23 NOTE — Assessment & Plan Note (Signed)
Some mildly decreased strength when compared to contralateral extremity. Discussed imaging.  Patient defers to her Orthopedist.  Avoid heavy lifting or overexertion.  Topical Aspercreme to LUE.  Tylenol or Ibuprofen if needed for pain. Patient to schedule an appointment with her specialist.

## 2014-03-23 NOTE — Assessment & Plan Note (Signed)
Health Maintenance reviewed.  Tetanus updated. Followed by OB/GYN for mammogram and routine gynecological examination.  Has upcoming appointment in September. Will obtain fasting labs.

## 2014-03-23 NOTE — Assessment & Plan Note (Signed)
Dexilant refilled.  Avoid trigger foods and late-night eating.  Elevate HOB.

## 2014-03-29 ENCOUNTER — Other Ambulatory Visit: Payer: Self-pay

## 2014-04-13 ENCOUNTER — Encounter: Payer: Self-pay | Admitting: *Deleted

## 2014-04-13 MED ORDER — CALCIUM CARBONATE-VITAMIN D 600-400 MG-UNIT PO CHEW: 2.0000 | CHEWABLE_TABLET | Freq: Every day | ORAL | Status: DC

## 2014-05-29 ENCOUNTER — Encounter: Payer: Self-pay | Admitting: Internal Medicine

## 2014-05-29 ENCOUNTER — Ambulatory Visit (INDEPENDENT_AMBULATORY_CARE_PROVIDER_SITE_OTHER): Payer: 59 | Admitting: Internal Medicine

## 2014-05-29 VITALS — BP 110/62 | HR 62 | Temp 97.1°F | Ht 59.0 in | Wt 124.4 lb

## 2014-05-29 DIAGNOSIS — E119 Type 2 diabetes mellitus without complications: Secondary | ICD-10-CM | POA: Insufficient documentation

## 2014-05-29 DIAGNOSIS — M899 Disorder of bone, unspecified: Secondary | ICD-10-CM

## 2014-05-29 DIAGNOSIS — K5900 Constipation, unspecified: Secondary | ICD-10-CM | POA: Insufficient documentation

## 2014-05-29 DIAGNOSIS — R7309 Other abnormal glucose: Secondary | ICD-10-CM

## 2014-05-29 DIAGNOSIS — M8580 Other specified disorders of bone density and structure, unspecified site: Secondary | ICD-10-CM | POA: Insufficient documentation

## 2014-05-29 DIAGNOSIS — M949 Disorder of cartilage, unspecified: Secondary | ICD-10-CM

## 2014-05-29 DIAGNOSIS — K219 Gastro-esophageal reflux disease without esophagitis: Secondary | ICD-10-CM

## 2014-05-29 DIAGNOSIS — R739 Hyperglycemia, unspecified: Secondary | ICD-10-CM | POA: Insufficient documentation

## 2014-05-29 MED ORDER — DEXLANSOPRAZOLE 60 MG PO CPDR: DELAYED_RELEASE_CAPSULE | ORAL | Status: DC

## 2014-05-29 NOTE — Progress Notes (Signed)
Pre visit review using our clinic review tool, if applicable. No additional management support is needed unless otherwise documented below in the visit note. 

## 2014-05-29 NOTE — Assessment & Plan Note (Signed)
Continue with symptoms, on-off, EGD 11/2012 normal. Recommend consistent use of PPIs in the morning, refill provided Discussed diet

## 2014-05-29 NOTE — Assessment & Plan Note (Signed)
Last A1c 5.7, recheck on return to the office

## 2014-05-29 NOTE — Assessment & Plan Note (Signed)
Density test 03/2014 showed T score of -1.8. Previously  she reports a history of osteoporosis but currently she has osteopenia. Recommend stay active, vitamin D supplements.

## 2014-05-29 NOTE — Progress Notes (Signed)
   Subjective:    Patient ID: Marissa Grant, female    DOB: 17-Jul-1956, 58 y.o.   MRN: [id number]  DOS:  05/29/2014 Type of visit - description : to establish Interval history: In general doing well. Recently  had a physical. Labs reviewed. She had a bone density test with osteopenia. Complain of chronic constipation for many years, would like a prescription for Linzess . Continue GERD symptoms on and off    ROS Denies nausea, vomiting, diarrhea or blood in the stools.  Past Medical History  Diagnosis Date  . Acid reflux   . Chicken pox as a child  . Osteopenia     Past Surgical History  Procedure Laterality Date  . Abdominal hysterectomy  1992    full  . Colonoscopy  01-02-2010       . Foot surgery - cotton, gastroc Left 09/01/2012    1st ray elevatus, Equinus Lt    History   Social History  . Marital Status: Widowed    Spouse Name: N/A    Number of Children: 2  . 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 Smoking status: Never Smoker   . Smokeless tobacco: Never Used  . Alcohol Use: Yes     Comment: rare  . Drug Use: No  . Sexual Activity: Not on file   Other Topics Concern  . Not on file   Social History Narrative   Original from [place] in the [place] 1988   Household pt and daughter         Medication List       This list is accurate as of: 05/29/14  9:22 PM.  Always use your most recent med list.               calcium-vitamin D 500-200 MG-UNIT per tablet  Commonly known as:  OSCAL WITH D  Take 1 tablet by mouth.     dexlansoprazole 60 MG capsule  Commonly known as:  DEXILANT  TAKE 1 CAPSULE BY MOUTH DAILY.     ibuprofen 200 MG tablet  Commonly known as:  ADVIL,MOTRIN  Take 20-800 mg by mouth every 6 (six) hours as needed. As needed for pain.     vitamin C 1000 MG tablet  Take 1,000 mg by mouth daily.           Objective:   Physical Exam BP 110/62  Pulse 62   Temp(Src) 97.1 F (36.2 C) (Oral)  Ht 4\' 11"  (1.499 m)  Wt 124 lb 6 oz (56.416 kg)  BMI 25.11 kg/m2  SpO2 97% General -- alert, well-developed, NAD.  Lungs -- normal respiratory effort, no intercostal retractions, no accessory muscle use, and normal breath sounds.  Heart-- normal rate, regular rhythm, no murmur.   Extremities-- no pretibial edema bilaterally  Neurologic--  alert & oriented X3. Speech normal, gait appropriate for age, strength symmetric and appropriate for age.  Psych-- Cognition and judgment appear intact. Cooperative with normal attention span and concentration. No anxious or depressed appearing.     Assessment & Plan:

## 2014-05-29 NOTE — Patient Instructions (Signed)
For constipation: miralax 17 g with fluids every day Metamucil 2 capsules daily  For osteopenia Vitamin D between 600 units and 1000 units daily   Next visit by 03-2015 , fasting , for a physical

## 2014-05-29 NOTE — Assessment & Plan Note (Addendum)
Reports a long history of constipation, request a prescription for linzess which has never been prescribed but she has tried samples from her daughter Had a negative colonoscopy in 2011. Plan: Recommend fiber intake, MiraLax for now; avoid calcium supplements

## 2014-10-15 ENCOUNTER — Ambulatory Visit (INDEPENDENT_AMBULATORY_CARE_PROVIDER_SITE_OTHER): Payer: 59 | Admitting: Internal Medicine

## 2014-10-15 ENCOUNTER — Encounter: Payer: Self-pay | Admitting: Internal Medicine

## 2014-10-15 VITALS — BP 108/67 | HR 61 | Temp 98.1°F | Ht 59.0 in | Wt 129.2 lb

## 2014-10-15 DIAGNOSIS — R109 Unspecified abdominal pain: Secondary | ICD-10-CM

## 2014-10-15 DIAGNOSIS — N390 Urinary tract infection, site not specified: Secondary | ICD-10-CM

## 2014-10-15 LAB — POCT URINALYSIS DIPSTICK
BILIRUBIN UA: NEGATIVE
Blood, UA: NEGATIVE
Glucose, UA: NEGATIVE
KETONES UA: NEGATIVE
Nitrite, UA: NEGATIVE
PH UA: 7
Protein, UA: NEGATIVE
Spec Grav, UA: 1.015
Urobilinogen, UA: 0.2

## 2014-10-15 MED ORDER — CIPROFLOXACIN HCL 500 MG PO TABS: 500.0000 mg | ORAL_TABLET | Freq: Two times a day (BID) | ORAL | Status: DC

## 2014-10-15 NOTE — Progress Notes (Signed)
Subjective:    Patient ID: Marissa Grant, female    DOB: Sep 19, 1955, 59 y.o.   MRN: [id number]  DOS:  10/15/2014 Type of visit - description : acute Interval history: Symptoms started 3 days ago with urinary frequency and lower abdominal discomfort mostly on the right side. She thinks she has a UTI.    Review of Systems  Denies fever chills No nausea, vomiting, diarrhea. Appetite is normal. She has chronic constipation, unchanged. Denies actual dysuria, gross hematuria difficulty urinating.  Past Medical History  Diagnosis Date  . Acid reflux   . Chicken pox as a child  . Osteopenia     Past Surgical History  Procedure Laterality Date  . Abdominal hysterectomy  1992    full  . Colonoscopy  01-02-2010       . Foot surgery - cotton, gastroc Left 09/01/2012    1st ray elevatus, Equinus Lt    History   Social History  . Marital Status: Widowed    Spouse Name: N/A    Number of Children: 2  . 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 Smoking status: Never Smoker   . Smokeless tobacco: Never Used  . Alcohol Use: Yes     Comment: rare  . Drug Use: No  . Sexual Activity: Not on file   Other Topics Concern  . Not on file   Social History Narrative   Original from [place] in the [place]   Household pt and daughter         Medication List       This list is accurate as of: 10/15/14  7:45 PM.  Always use your most recent med list.               calcium-vitamin D 500-200 MG-UNIT per tablet  Commonly known as:  OSCAL WITH D  Take 1 tablet by mouth.     ciprofloxacin 500 MG tablet  Commonly known as:  CIPRO  Take 1 tablet (500 mg total) by mouth 2 (two) times daily.     dexlansoprazole 60 MG capsule  Commonly known as:  DEXILANT  TAKE 1 CAPSULE BY MOUTH DAILY.     ibuprofen 200 MG tablet  Commonly known as:  ADVIL,MOTRIN  Take 20-800 mg by mouth every 6 (six) hours as needed. As  needed for pain.     vitamin C 1000 MG tablet  Take 1,000 mg by mouth daily.           Objective:   Physical Exam  Constitutional: She appears well-developed. No distress.  Abdominal:  Abdomen nondistended, soft, slightly tender at the lower abdomen bilaterally without mass, guarding or rebound. No CVA tenderness  Musculoskeletal: She exhibits no edema.  Skin: Skin is warm and dry. She is not diaphoretic.  Psychiatric: She has a normal mood and affect. Her behavior is normal. Judgment and thought content normal.        Assessment & Plan:   UTI, Udip + Symptoms consistent with a UTI, she is tender and the lower abdomen, no peritoneal signs; will treat for UTI but definitely advise her to call us if she's not improving. See instructions   Problem List Items Addressed This Visit    None    Visit Diagnoses    Abdominal pain, unspecified abdominal location    -  Primary    Relevant Orders    POCT Urinalysis  Dipstick (Completed)    Urine Culture    Urinalysis, Routine w reflex microscopic

## 2014-10-15 NOTE — Patient Instructions (Signed)
Take ciprofloxacin twice a day for 5 days Drink plenty of fluids If you are not gradually improving in the next few days please call the office  Next visit is due by July 2016 for a physical     Urinary Tract Infection Urinary tract infections (UTIs) can develop anywhere along your urinary tract. Your urinary tract is your body's drainage system for removing wastes and extra water. Your urinary tract includes two kidneys, two ureters, a bladder, and a urethra. Your kidneys are a pair of bean-shaped organs. Each kidney is about the size of your fist. They are located below your ribs, one on each side of your spine. CAUSES Infections are caused by microbes, which are microscopic organisms, including fungi, viruses, and bacteria. These organisms are so small that they can only be seen through a microscope. Bacteria are the microbes that most commonly cause UTIs. SYMPTOMS  Symptoms of UTIs may vary by age and gender of the patient and by the location of the infection. Symptoms in young women typically include a frequent and intense urge to urinate and a painful, burning feeling in the bladder or urethra during urination. Older women and men are more likely to be tired, shaky, and weak and have muscle aches and abdominal pain. A fever may mean the infection is in your kidneys. Other symptoms of a kidney infection include pain in your back or sides below the ribs, nausea, and vomiting. DIAGNOSIS To diagnose a UTI, your caregiver will ask you about your symptoms. Your caregiver also will ask to provide a urine sample. The urine sample will be tested for bacteria and white blood cells. White blood cells are made by your body to help fight infection. TREATMENT  Typically, UTIs can be treated with medication. Because most UTIs are caused by a bacterial infection, they usually can be treated with the use of antibiotics. The choice of antibiotic and length of treatment depend on your symptoms and the type of  bacteria causing your infection. HOME CARE INSTRUCTIONS  If you were prescribed antibiotics, take them exactly as your caregiver instructs you. Finish the medication even if you feel better after you have only taken some of the medication.  Drink enough water and fluids to keep your urine clear or pale yellow.  Avoid caffeine, tea, and carbonated beverages. They tend to irritate your bladder.  Empty your bladder often. Avoid holding urine for long periods of time.  Empty your bladder before and after sexual intercourse.  After a bowel movement, women should cleanse from front to back. Use each tissue only once. SEEK MEDICAL CARE IF:   You have back pain.  You develop a fever.  Your symptoms do not begin to resolve within 3 days. SEEK IMMEDIATE MEDICAL CARE IF:   You have severe back pain or lower abdominal pain.  You develop chills.  You have nausea or vomiting.  You have continued burning or discomfort with urination. MAKE SURE YOU:   Understand these instructions.  Will watch your condition.  Will get help right away if you are not doing well or get worse. Document Released: 06/03/2005 Document Revised: 02/23/2012 Document Reviewed: 10/02/2011 [facility] Patient Information 2015 [place], [place]. This information is not intended to replace advice given to you by your health care provider. Make sure you discuss any questions you have with your health care provider.

## 2014-10-15 NOTE — Progress Notes (Signed)
Pre visit review using our clinic review tool, if applicable. No additional management support is needed unless otherwise documented below in the visit note. 

## 2014-10-16 LAB — URINALYSIS, ROUTINE W REFLEX MICROSCOPIC
BILIRUBIN URINE: NEGATIVE
Hgb urine dipstick: NEGATIVE
KETONES UR: NEGATIVE
Nitrite: NEGATIVE
PH: 7.5 (ref 5.0–8.0)
RBC / HPF: NONE SEEN (ref 0–?)
SPECIFIC GRAVITY, URINE: 1.01 (ref 1.000–1.030)
Total Protein, Urine: NEGATIVE
URINE GLUCOSE: NEGATIVE
Urobilinogen, UA: 0.2 (ref 0.0–1.0)

## 2014-10-16 LAB — URINE CULTURE
COLONY COUNT: NO GROWTH
ORGANISM ID, BACTERIA: NO GROWTH

## 2014-10-18 NOTE — Addendum Note (Signed)
Addended by: Janalee Dane C on: 10/18/2014 11:04 AM   Modules accepted: Orders, Medications

## 2015-01-01 ENCOUNTER — Telehealth: Payer: Self-pay | Admitting: Internal Medicine

## 2015-01-01 NOTE — Telephone Encounter (Signed)
Error

## 2015-01-09 ENCOUNTER — Ambulatory Visit (INDEPENDENT_AMBULATORY_CARE_PROVIDER_SITE_OTHER): Payer: 59 | Admitting: Internal Medicine

## 2015-01-09 ENCOUNTER — Encounter: Payer: Self-pay | Admitting: Internal Medicine

## 2015-01-09 VITALS — BP 118/62 | HR 64 | Temp 97.8°F | Ht 59.0 in | Wt 126.5 lb

## 2015-01-09 DIAGNOSIS — G609 Hereditary and idiopathic neuropathy, unspecified: Secondary | ICD-10-CM | POA: Diagnosis not present

## 2015-01-09 LAB — FOLATE: Folate: 9.8 ng/mL (ref >5.9)

## 2015-01-09 LAB — SEDIMENTATION RATE: Sed Rate: 21 mm/hr (ref 0–22)

## 2015-01-09 MED ORDER — GABAPENTIN 300 MG PO CAPS: 300.0000 mg | ORAL_CAPSULE | Freq: Three times a day (TID) | ORAL | Status: DC

## 2015-01-09 NOTE — Patient Instructions (Addendum)
Get your blood work before you leave    Gabapentin 300 mg: 1 tablet at bedtime for 10 days Then one tablet twice a day for 10 days Then 1 tablet 3 times a day  Come back to the office in 6 weeks for a routine check up

## 2015-01-09 NOTE — Progress Notes (Signed)
Subjective:    Patient ID: Marissa Grant, female    DOB: 1956/05/03, 59 y.o.   MRN: [id number]  DOS:  01/09/2015 Type of visit - description : acute Interval history: Symptoms started 3 years ago with on and off severe burning at the plantar aspect of the feet. Symptoms are gradually worsening, now they are  steady and have spread up to the mid pretibial area. Initially went to see a  Podiatrist, had surgery, but did not help. On 12-2013 went to orthopedic surgery, diagnosed with neuropathy, prescribed Neurontin 100 mg-- no  Help. Symptoms are day or night.    Review of Systems Denies problems with numbness, no lower extremity rash. Occasional lower extremity edema. No chest pain or difficulty breathing No major problems with back pain, occasional knee discomfort for when she does heavy lifting at work; denies lower extremity numbness  Past Medical History  Diagnosis Date  . Acid reflux   . Chicken pox as a child  . Osteopenia     Past Surgical History  Procedure Laterality Date  . Abdominal hysterectomy  1992    full  . Colonoscopy  01-02-2010       . Foot surgery - cotton, gastroc Left 09/01/2012    1st ray elevatus, Equinus Lt    History   Social History  . Marital Status: Widowed    Spouse Name: N/A  . Number of Children: 2  . 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 Smoking status: Never Smoker   . Smokeless tobacco: Never Used  . Alcohol Use: Yes     Comment: rare  . Drug Use: No  . Sexual Activity: Not on file   Other Topics Concern  . Not on file   Social History Narrative   Original from [place] in the [place]   Household pt and daughter         Medication List       This list is accurate as of: 01/09/15  5:56 PM.  Always use your most recent med list.               dexlansoprazole 60 MG capsule  Commonly known as:  DEXILANT  TAKE 1 CAPSULE BY MOUTH DAILY.     gabapentin 300 MG capsule  Commonly known as:  NEURONTIN  Take 1 capsule (300 mg total) by mouth 3 (three) times daily.     ibuprofen 200 MG tablet  Commonly known as:  ADVIL,MOTRIN  Take 20-800 mg by mouth every 6 (six) hours as needed. As needed for pain.     OMEGA 3-6-9 COMPLEX PO  Take 1 tablet by mouth daily.           Objective:   Physical Exam BP 118/62 mmHg  Pulse 64  Temp(Src) 97.8 F (36.6 C) (Oral)  Ht 4\' 11"  (1.499 m)  Wt 126 lb 8 oz (57.38 kg)  BMI 25.54 kg/m2  SpO2 99% General:   Well developed, well nourished . NAD.  Ext- No lower extremity edema, good bilateral pedal pulses, leg skin normal. Skin: Not pale. Not jaundice Neurologic:  alert & oriented X3.  Speech normal, gait appropriate for age and unassisted Motor exam and DTRs symmetric. Pinprick examination normal. Psych--  Cognition and judgment appear intact.  Cooperative with normal attention span and concentration.  Behavior appropriate. No anxious or depressed appearing.        Assessment &  Plan:

## 2015-01-09 NOTE — Progress Notes (Signed)
Pre visit review using our clinic review tool, if applicable. No additional management support is needed unless otherwise documented below in the visit note. 

## 2015-01-09 NOTE — Assessment & Plan Note (Signed)
Symptoms consistent with peripheral neuropathy, on chart review few months ago her vitamin B12 and vitamin D were normal, A1c was 5.7, normal renal function and no anemia. Plan:  Nerve conduction study Labs including ANA, RPR, sedimentation rate, folic acid, SPEP. Treatment with gabapentin, initially failed a very low-dose Further advice was results Follow-up in 6 weeks

## 2015-01-10 LAB — ANA: ANA: NEGATIVE

## 2015-01-10 LAB — RPR

## 2015-01-10 LAB — RHEUMATOID FACTOR

## 2015-01-17 LAB — PROTEIN ELECTROPHORESIS, SERUM, WITH REFLEX
ALPHA-1-GLOBULIN: 0.2 g/dL (ref 0.2–0.3)
ALPHA-2-GLOBULIN: 0.7 g/dL (ref 0.5–0.9)
Albumin ELP: 4.1 g/dL (ref 3.8–4.8)
Beta 2: 0.4 g/dL (ref 0.2–0.5)
Beta Globulin: 0.4 g/dL (ref 0.4–0.6)
Gamma Globulin: 1.2 g/dL (ref 0.8–1.7)
Total Protein, Serum Electrophoresis: 7 g/dL (ref 6.1–8.1)

## 2015-02-25 ENCOUNTER — Ambulatory Visit: Payer: 59 | Admitting: Internal Medicine

## 2015-02-26 ENCOUNTER — Encounter: Payer: Self-pay | Admitting: Internal Medicine

## 2015-02-26 ENCOUNTER — Ambulatory Visit (INDEPENDENT_AMBULATORY_CARE_PROVIDER_SITE_OTHER): Payer: 59 | Admitting: Internal Medicine

## 2015-02-26 VITALS — BP 120/68 | HR 60 | Temp 98.2°F | Ht 59.0 in | Wt 126.5 lb

## 2015-02-26 DIAGNOSIS — K219 Gastro-esophageal reflux disease without esophagitis: Secondary | ICD-10-CM

## 2015-02-26 DIAGNOSIS — G609 Hereditary and idiopathic neuropathy, unspecified: Secondary | ICD-10-CM

## 2015-02-26 MED ORDER — GABAPENTIN 400 MG PO CAPS: 400.0000 mg | ORAL_CAPSULE | Freq: Three times a day (TID) | ORAL | Status: DC

## 2015-02-26 MED ORDER — DEXLANSOPRAZOLE 60 MG PO CPDR: 60.0000 mg | DELAYED_RELEASE_CAPSULE | Freq: Every day | ORAL | Status: DC

## 2015-02-26 NOTE — Patient Instructions (Signed)
Take the new dose of gabapentin 3 times a day If after 2 or 3 weeks the symptoms are still noticeable, please let me know because we can increase your dose

## 2015-02-26 NOTE — Progress Notes (Signed)
Pre visit review using our clinic review tool, if applicable. No additional management support is needed unless otherwise documented below in the visit note. 

## 2015-02-26 NOTE — Assessment & Plan Note (Addendum)
Since the last office visit, labs were unremarkable Partial response  Gabapentin 300 mg, increase to 400 mg TID NCS not done but pt interested-----reschedule Call in few weeks if feels dose needs to be increased, otherwise f/u 6 months

## 2015-02-26 NOTE — Progress Notes (Signed)
   Subjective:    Patient ID: Marissa Grant, female    DOB: 01/20/1956, 59 y.o.   MRN: [id number]  DOS:  02/26/2015 Type of visit - description : f/u Interval history: Diagnosed with neuropathy the last time she was here, taking gabapentin, symptoms definitely improved but not 100%.  Review of Systems  Denies any nausea, vomiting, diarrhea. No headache or dizziness Has episodic low back pain, not severe or persistent.  Past Medical History  Diagnosis Date  . Acid reflux   . Chicken pox as a child  . Osteopenia     Past Surgical History  Procedure Laterality Date  . Abdominal hysterectomy  1992    full  . Colonoscopy  01-02-2010       . Foot surgery - cotton, gastroc Left 09/01/2012    1st ray elevatus, Equinus Lt    History   Social History  . Marital Status: Widowed    Spouse Name: N/A  . Number of Children: 2  . 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 Smoking status: Never Smoker   . Smokeless tobacco: Never Used  . Alcohol Use: Yes     Comment: rare  . Drug Use: No  . Sexual Activity: Not on file   Other Topics Concern  . Not on file   Social History Narrative   Original from [place] in the [place]   Household pt and daughter         Medication List       This list is accurate as of: 02/26/15 10:31 PM.  Always use your most recent med list.               dexlansoprazole 60 MG capsule  Commonly known as:  DEXILANT  Take 1 capsule (60 mg total) by mouth daily.     gabapentin 400 MG capsule  Commonly known as:  NEURONTIN  Take 1 capsule (400 mg total) by mouth 3 (three) times daily.     ibuprofen 200 MG tablet  Commonly known as:  ADVIL,MOTRIN  Take 20-800 mg by mouth every 6 (six) hours as needed. As needed for pain.     OMEGA 3-6-9 COMPLEX PO  Take 1 tablet by mouth daily.           Objective:   Physical Exam BP 120/68 mmHg  Pulse 60  Temp(Src) 98.2 F  (36.8 C) (Oral)  Ht 4\' 11"  (1.499 m)  Wt 126 lb 8 oz (57.38 kg)  BMI 25.54 kg/m2  SpO2 99%  General:   Well developed, well nourished . NAD.  HEENT:  Normocephalic . Face symmetric, atraumatic Neck: No thyromegaly Lungs:  CTA B Normal respiratory effort, no intercostal retractions, no accessory muscle use. Heart: RRR,  no murmur.  No pretibial edema bilaterally  Skin: Not pale. Not jaundice Neurologic:  alert & oriented X3.  Speech normal, gait appropriate for age and unassisted Psych--  Cognition and judgment appear intact.  Cooperative with normal attention span and concentration.  Behavior appropriate. No anxious or depressed appearing.       Assessment & Plan:

## 2015-02-28 ENCOUNTER — Telehealth: Payer: Self-pay | Admitting: Internal Medicine

## 2015-02-28 NOTE — Telephone Encounter (Signed)
Patient states that the higher dosage of gabapentin has made her sick. bp is 129/63. Feels like she could pass out. Call transferred her to [facility].

## 2015-02-28 NOTE — Telephone Encounter (Signed)
noted 

## 2015-02-28 NOTE — Telephone Encounter (Signed)
Patient Name: Marissa Grant DOB: February 20, 1956 Initial Comment Caller states BP is high for her 129/67, feels like she could pass out. Just put on higher dose of gabapentin. Nurse Assessment Nurse: Ronnald Ramp, RN, Miranda Date/Time (Eastern Time): 02/28/2015 11:44:49 AM Confirm and document reason for call. If symptomatic, describe symptoms. ---Caller states she took an increased her dose Gabapentin yesterday morning and she had dizziness and felt like she was going to pass out. She has not taken any more of the Gabapentin. She is feeling fine today. Has the patient traveled out of the country within the last 30 days? ---Not Applicable Does the patient require triage? ---No Please document clinical information provided and list any resource used. ---Told caller that it is not uncommon to have side effects when increasing dose of medication. Recommended she continue with the medication and allow 3-5 days for her body to acclimate to the new dosage. https://gentry.org/ SCBI/P779396.UGAY Guidelines Guideline Title Affirmed Question Affirmed Notes Final Disposition User Clinical Call Ronnald Ramp, RN, Miranda Comments Have not been able to reach the pt on the primary number. Found an alternate number on pt's chart in EPIC and reach the pt.

## 2015-02-28 NOTE — Telephone Encounter (Signed)
FYI.  Pt's BP during last office visit on 02/26/15 was 120/68.

## 2015-04-01 ENCOUNTER — Encounter: Payer: 59 | Admitting: Internal Medicine

## 2015-05-02 LAB — HM MAMMOGRAPHY

## 2015-08-28 ENCOUNTER — Telehealth: Payer: Self-pay | Admitting: Behavioral Health

## 2015-08-28 NOTE — Telephone Encounter (Signed)
Unable to reach patient at time of Pre-Visit Call.  Left message for patient to return call when available.    

## 2015-08-29 ENCOUNTER — Encounter: Payer: Self-pay | Admitting: Internal Medicine

## 2015-08-29 ENCOUNTER — Ambulatory Visit (INDEPENDENT_AMBULATORY_CARE_PROVIDER_SITE_OTHER): Payer: 59 | Admitting: Internal Medicine

## 2015-08-29 VITALS — BP 118/70 | HR 62 | Temp 98.3°F | Ht 59.0 in | Wt 124.1 lb

## 2015-08-29 DIAGNOSIS — Z131 Encounter for screening for diabetes mellitus: Secondary | ICD-10-CM | POA: Diagnosis not present

## 2015-08-29 DIAGNOSIS — Z0000 Encounter for general adult medical examination without abnormal findings: Secondary | ICD-10-CM | POA: Diagnosis not present

## 2015-08-29 DIAGNOSIS — G609 Hereditary and idiopathic neuropathy, unspecified: Secondary | ICD-10-CM

## 2015-08-29 DIAGNOSIS — Z09 Encounter for follow-up examination after completed treatment for conditions other than malignant neoplasm: Secondary | ICD-10-CM

## 2015-08-29 LAB — COMPREHENSIVE METABOLIC PANEL
ALT: 11 U/L (ref 0–35)
AST: 15 U/L (ref 0–37)
Albumin: 3.9 g/dL (ref 3.5–5.2)
Alkaline Phosphatase: 56 U/L (ref 39–117)
BUN: 25 mg/dL — AB (ref 6–23)
CHLORIDE: 111 meq/L (ref 96–112)
CO2: 29 meq/L (ref 19–32)
Calcium: 9.3 mg/dL (ref 8.4–10.5)
Creatinine, Ser: 0.75 mg/dL (ref 0.40–1.20)
GFR: 84.36 mL/min (ref >60.00)
Glucose, Bld: 110 mg/dL — ABNORMAL HIGH (ref 70–99)
Potassium: 4 mEq/L (ref 3.5–5.1)
SODIUM: 146 meq/L — AB (ref 135–145)
Total Bilirubin: 0.8 mg/dL (ref 0.2–1.2)
Total Protein: 6.9 g/dL (ref 6.0–8.3)

## 2015-08-29 LAB — HIV ANTIBODY (ROUTINE TESTING W REFLEX): HIV: NONREACTIVE

## 2015-08-29 LAB — LIPID PANEL
CHOL/HDL RATIO: 3
Cholesterol: 140 mg/dL (ref 0–200)
HDL: 54.2 mg/dL (ref >39.00)
LDL CALC: 72 mg/dL (ref 0–99)
NonHDL: 85.8
TRIGLYCERIDES: 71 mg/dL (ref 0.0–149.0)
VLDL: 14.2 mg/dL (ref 0.0–40.0)

## 2015-08-29 LAB — HEMOGLOBIN A1C: Hgb A1c MFr Bld: 5.7 % (ref 4.6–6.5)

## 2015-08-29 LAB — TSH: TSH: 1.99 u[IU]/mL (ref 0.35–4.50)

## 2015-08-29 NOTE — Assessment & Plan Note (Signed)
Td 2015 Had a flu shot Colonoscopy 2011 at [facility], told normal, repeat in 10 years, no documentation Female care--  Sees gynecology Mammogram [phone] negative Diet and exercise discussed Labs: CMP, FLP, A1c, TSH, HIV

## 2015-08-29 NOTE — Patient Instructions (Signed)
Before you leave the office:  [facility]  and get the blood work    [place]   Schedule a complete physical exam to be done in one year Please be fasting

## 2015-08-29 NOTE — Progress Notes (Signed)
Subjective:    Patient ID: Marissa Grant, female    DOB: 1956/08/28, 59 y.o.   MRN: [id number]  DOS:  08/29/2015 Type of visit - description : CPX Interval history: Has a few concerns, see review of systems    Review of Systems  Constitutional: No fever. No chills. No unexplained wt changes. No unusual sweats  HEENT: No dental problems, no ear discharge, no facial swelling, no voice changes. No eye discharge, no eye  redness , no  intolerance to light   Respiratory: No wheezing , no  difficulty breathing. No cough , no mucus production  Cardiovascular: No CP, no leg swelling , no  Palpitations  GI: no nausea, no vomiting, no diarrhea , no  abdominal pain.  No blood in the stools. No dysphagia, no odynophagia    Endocrine: No polyphagia, no polyuria , no polydipsia  GU: No dysuria, gross hematuria, difficulty urinating. Occasionally urinary urgency and frequency, not a new problem, already discussed with gynecology  Musculoskeletal:  3 Weeks history of mid back pain without radiation. No fever, chills or abdominal pain  Skin: No change in the color of the skin, palor , no  Rash  Allergic, immunologic: No environmental allergies , no  food allergies  Neurological: No dizziness no  syncope. No headaches. No diplopia, no slurred, no slurred speech, no motor deficits, no facial  Numbness  Hematological: No enlarged lymph nodes, no easy bruising , no unusual bleedings  Psychiatry: No suicidal ideas, no hallucinations, no beavior problems, no confusion.  No unusual/severe anxiety, no depression  Past Medical History  Diagnosis Date  . Acid reflux   . Chicken pox as a child  . Osteopenia     Past Surgical History  Procedure Laterality Date  . Abdominal hysterectomy  1992    ?oophotrectomy  . Colonoscopy  01-02-2010       . Foot surgery - cotton, gastroc Left 09/01/2012    1st ray elevatus, Equinus Lt    Social History   Social History  . Marital Status: Widowed   Spouse Name: N/A  . Number of Children: 2  . Years of Education: N/A   Occupational History  . nurse tech [employer]   Social History Main Topics  . Smoking status: Never Smoker   . Smokeless tobacco: Never Used  . Alcohol Use: Yes     Comment: rare  . Drug Use: No  . Sexual Activity: Not on file   Other Topics Concern  . Not on file   Social History Narrative   Original from [place] in the [place]   Household pt and daughter      Family History  Problem Relation Age of Onset  . Colon cancer Neg Hx   . Alzheimer's disease Mother   . Breast cancer Neg Hx   . Diabetes Neg Hx   . CAD Neg Hx        Medication List       This list is accurate as of: 08/29/15 11:59 PM.  Always use your most recent med list.               dexlansoprazole 60 MG capsule  Commonly known as:  DEXILANT  Take 1 capsule (60 mg total) by mouth daily.     ibuprofen 200 MG tablet  Commonly known as:  ADVIL,MOTRIN  Take 20-800 mg by mouth every 6 (six) hours as needed. As needed for pain.     VITAMIN D (CHOLECALCIFEROL) PO  Take by mouth daily.           Objective:   Physical Exam BP 118/70 mmHg  Pulse 62  Temp(Src) 98.3 F (36.8 C) (Oral)  Ht 4\' 11"  (1.499 m)  Wt 124 lb 2 oz (56.303 kg)  BMI 25.06 kg/m2  SpO2 97%  General:   Well developed, well nourished . NAD.  HEENT:  Normocephalic . Face symmetric, atraumatic Lungs:  CTA B Normal respiratory effort, no intercostal retractions, no accessory muscle use. Heart: RRR,  no murmur.  no pretibial edema bilaterally  MSK: No TTP at the thoracic spine Abdomen:  Not distended, soft, non-tender. No rebound or rigidity. No mass,organomegaly Skin: Not pale. Not jaundice Neurologic:  alert & oriented X3.  Speech normal, gait appropriate for age and unassisted Psych--  Cognition and judgment appear intact.  Cooperative with normal attention span and concentration.  Behavior appropriate. No anxious or depressed  appearing.    Assessment & Plan:   Assessment Hyperglycemia, A1c is 5.7 (2015) GERD Neuropathy: 2016 labs (-), rx gabapentin (mild response but intolerant); rx NCS, not done  Osteoporosis: dx previously but  Tscore -1.8 (03-2014) , om ca and vit d  PLAN: Hyperglycemia: Check A1c Neuropathy: Ongoing symptoms, reports that she could not tolerate gabapentin. Now also having back pain seems to be a MSK problem. Referral to neurology RTC one year

## 2015-08-29 NOTE — Progress Notes (Signed)
Pre visit review using our clinic review tool, if applicable. No additional management support is needed unless otherwise documented below in the visit note. 

## 2015-08-30 DIAGNOSIS — Z09 Encounter for follow-up examination after completed treatment for conditions other than malignant neoplasm: Secondary | ICD-10-CM | POA: Insufficient documentation

## 2015-08-30 NOTE — Assessment & Plan Note (Signed)
Hyperglycemia: Check A1c Neuropathy: Ongoing symptoms, reports that she could not tolerate gabapentin. Now also having back pain seems to be a MSK problem. Referral to neurology RTC one year

## 2015-10-21 ENCOUNTER — Ambulatory Visit (INDEPENDENT_AMBULATORY_CARE_PROVIDER_SITE_OTHER): Payer: 59 | Admitting: Neurology

## 2015-10-21 ENCOUNTER — Encounter: Payer: Self-pay | Admitting: Neurology

## 2015-10-21 VITALS — BP 110/70 | HR 73 | Ht 59.0 in | Wt 126.3 lb

## 2015-10-21 DIAGNOSIS — M545 Low back pain, unspecified: Secondary | ICD-10-CM

## 2015-10-21 DIAGNOSIS — R202 Paresthesia of skin: Secondary | ICD-10-CM | POA: Diagnosis not present

## 2015-10-21 MED ORDER — NORTRIPTYLINE HCL 10 MG PO CAPS: ORAL_CAPSULE | ORAL | Status: DC

## 2015-10-21 MED FILL — NORTRIPTYLINE HCL 10 MG CAP: 10 | 30 days supply | Qty: 60 | Fill #0

## 2015-10-21 NOTE — Progress Notes (Signed)
[facility] Clinic Note - Initial Visit   Date: 10/21/2015  Marissa Grant MRN: [id number] DOB: October 04, 1955   Dear Dr Larose Kells:  Thank you for your kind referral of Marissa Grant for consultation of bilateral feet paresthesias. Although her history is well known to you, please allow [place] to reiterate it for the purpose of our medical record. The patient was accompanied to the clinic by self.    History of Present Illness: Marissa Grant is a 60 y.o. right-handed [place] female with GERD presenting for evaluation of bilateral feet tingling and low back pain.  Starting around 2013, she began experiencing a constant burning sensation of the soles of the feet and lower legs to her knees.  She denies numbness/tingling, radicular pain, or weakness. It is worse with prolonged walking and improved by elevating her feet. She went to see [facility] and had left foot surgery, but did not notice any improvement.  She also saw orthopeadic surgery and was started on gabapentin 300mg  three times daily without benefit.  Higher dosages made her very sedated.  She discontinued this in December 2016.   Around 2003, she reports having chronic low back pain which started after trying to pull a patient upright.  Since then, she has bouts of low back pain, worse in the the winter.  Pain is described as burning and localized to her low back.  She takes ibuprofen which helps.    There is no history of diabetes or significant alcohol use.  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   Out-side paper records, electronic medical record, and images have been reviewed where available and summarized as:   Lab Results  Component Value Date   TSH 1.99 08/29/2015   Lab Results  Component Value Date   HGBA1C 5.7 08/29/2015   Lab Results  Component Value Date   VITAMINB12 470 03/16/2014    Past Medical History  Diagnosis Date  . Acid reflux   . Chicken pox as  a child  . Osteopenia     Past Surgical History  Procedure Laterality Date  . Abdominal hysterectomy  1992    ?oophotrectomy  . Colonoscopy  01-02-2010       . Foot surgery - cotton, gastroc Left 09/01/2012    1st ray elevatus, Equinus Lt     Medications:  Outpatient Encounter Prescriptions as of 10/21/2015  Medication Sig Note  . dexlansoprazole (DEXILANT) 60 MG capsule Take 1 capsule (60 mg total) by mouth daily.   [name] Kitchen ibuprofen (ADVIL,MOTRIN) 200 MG tablet Take 20-800 mg by mouth every 6 (six) hours as needed. As needed for pain. 01/09/2015: PRN  . VITAMIN D, CHOLECALCIFEROL, PO Take by mouth daily.   . nortriptyline (PAMELOR) 10 MG capsule Take 1 tablet at bedtime for 2 weeks, then increase to 2 tablets at bedtime    No facility-administered encounter medications on file as of 10/21/2015.     Allergies:  Allergies  Allergen Reactions  . Pollen Extract Other (See Comments)    Nasal congestion.     Family History: Family History  Problem Relation Age of Onset  . Colon cancer Neg Hx   . Alzheimer's disease Mother   . Breast cancer Neg Hx   . Diabetes Neg Hx   . CAD Neg Hx   . Healthy Daughter     Social History: Social History  Substance Use Topics  . Smoking status: Never Smoker   . Smokeless tobacco: Never Used  .  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    Review of Systems:  CONSTITUTIONAL: No fevers, chills, night sweats, or weight loss.   EYES: No visual changes or eye pain ENT: No hearing changes.  No history of nose bleeds.   RESPIRATORY: No cough, wheezing and shortness of breath.   CARDIOVASCULAR: Negative for chest pain, and palpitations.   GI: Negative for abdominal discomfort, blood in stools or black stools.  No recent  change in bowel habits.   GU:  No history of incontinence.   MUSCLOSKELETAL: +history of joint pain or swelling.  No myalgias.   SKIN: Negative for lesions, rash, and itching.   HEMATOLOGY/ONCOLOGY: Negative for prolonged bleeding, bruising easily, and swollen nodes.  No history of cancer.   ENDOCRINE: Negative for cold or heat intolerance, polydipsia or goiter.   PSYCH:  No depression or anxiety symptoms.   NEURO: As Above.   Vital Signs:  BP 110/70 mmHg  Pulse 73  Ht 4\' 11"  (1.499 m)  Wt 126 lb 5 oz (57.295 kg)  BMI 25.50 kg/m2  SpO2 96% Pain Scale: 3 on a scale of 0-10   General Medical Exam:   General:  Well appearing, comfortable.   Eyes/ENT: see cranial nerve examination.   Neck: No masses appreciated.  Full range of motion without tenderness.  No carotid bruits. Respiratory:  Clear to auscultation, good air entry bilaterally.   Cardiac:  Regular rate and rhythm, no murmur.   Extremities:  No deformities, edema, or skin discoloration.  Skin:  No rashes or lesions.  Neurological Exam: MENTAL STATUS including orientation to time, place, person, recent and remote memory, attention span and concentration, language, and fund of knowledge is normal.  Speech is not dysarthric.  CRANIAL NERVES: II:  No visual field defects.  Unremarkable fundi.   III-IV-VI: Pupils equal round and reactive to light.  Normal conjugate, extra-ocular eye movements in all directions of gaze.  No nystagmus.  No ptosis.   V:  Normal facial sensation.    VII:  Normal facial symmetry and movements.  VIII:  Normal hearing and vestibular function.   IX-X:  Normal palatal movement.   XI:  Normal shoulder shrug and head rotation.   XII:  Normal tongue strength and range of motion, no deviation or fasciculation.  MOTOR:  No atrophy, fasciculations or abnormal movements.  No pronator drift.  Tone is normal.    Right Upper Extremity:    Left Upper Extremity:    Deltoid  5/5   Deltoid  5/5   Biceps  5/5    Biceps  5/5   Triceps  5/5   Triceps  5/5   Wrist extensors  5/5   Wrist extensors  5/5   Wrist flexors  5/5   Wrist flexors  5/5   Finger extensors  5/5   Finger extensors  5/5   Finger flexors  5/5   Finger flexors  5/5   Dorsal interossei  5/5   Dorsal interossei  5/5   Abductor pollicis  5/5   Abductor pollicis  5/5   Tone (Ashworth scale)  0  Tone (Ashworth scale)  0   Right Lower Extremity:    Left  Lower Extremity:    Hip flexors  5/5   Hip flexors  5/5   Hip extensors  5/5   Hip extensors  5/5   Knee flexors  5/5   Knee flexors  5/5   Knee extensors  5/5   Knee extensors  5/5   Dorsiflexors  5/5   Dorsiflexors  5/5   Plantarflexors  5/5   Plantarflexors  5/5   Toe extensors  5/5   Toe extensors  5/5   Toe flexors  5/5   Toe flexors  5/5   Tone (Ashworth scale)  0  Tone (Ashworth scale)  0   MSRs:  Right                                                                 Left brachioradialis 2+  brachioradialis 2+  biceps 2+  biceps 2+  triceps 2+  triceps 2+  patellar 2+  patellar 2+  ankle jerk 2+  ankle jerk 2+  Hoffman no  Hoffman no  plantar response down  plantar response down   SENSORY:  Normal and symmetric perception of light touch, pinprick, vibration, and proprioception.  Romberg's sign absent.   COORDINATION/GAIT: Normal finger-to- nose-finger and heel-to-shin.  Intact rapid alternating movements bilaterally.  Able to rise from a chair without using arms.  Gait narrow based and stable. Tandem and stressed gait intact.    IMPRESSION: Mrs. Fabbro is a 60 year-old female referred for evaluation of bilateral leg paresthesias and low back pain.  Her neurological exam is entirely normal and non-focal making her symptoms difficult to localize.  The nature of her leg pain is greater than what is expected in neuropathy as this tends to be distal predominant.  For completeness, NCS/EMG of the legs will be performed to be sure a S1 radiculopathy is not missed.  Low back pain is  most likely degenerative arthritis and she is getting adequate pain relief with iburpofen alone.  If her NCS/EMG is normal, she may need to have vascular studies of the leg.  In the meantime, I offered her a trial of nortriptyline 10mg  at bedtime to titrate to 20mg  qhs.  Return to clinic in 2 months   The duration of this appointment visit was 40 minutes of face-to-face time with the patient.  Greater than 50% of this time was spent in counseling, explanation of diagnosis, planning of further management, and coordination of care.   Thank you for allowing me to participate in patient's care.  If I can answer any additional questions, I would be pleased to do so.    Sincerely,    Wandy Bossler K. Posey Pronto, DO

## 2015-10-21 NOTE — Patient Instructions (Addendum)
1.  NCS/EMG of the legs 2.  Start nortriptlyine 10mg  at bedtime for one week, then increase to 2 tablets (20mg ) at bedtime  3.  Return to clinic in 2 months

## 2015-11-05 ENCOUNTER — Encounter: Payer: Self-pay | Admitting: *Deleted

## 2015-11-05 ENCOUNTER — Encounter: Payer: Self-pay | Admitting: Neurology

## 2015-11-05 ENCOUNTER — Ambulatory Visit (INDEPENDENT_AMBULATORY_CARE_PROVIDER_SITE_OTHER): Payer: 59 | Admitting: Neurology

## 2015-11-05 DIAGNOSIS — M545 Low back pain, unspecified: Secondary | ICD-10-CM

## 2015-11-05 DIAGNOSIS — R202 Paresthesia of skin: Secondary | ICD-10-CM

## 2015-11-05 NOTE — Procedures (Signed)
[facility]  [place]  [place], [place] [address] Tel: [phone] Fax:  [phone] Test Date:  11/05/2015  Patient: Marissa Grant DOB: [birth year] Physician: Narda Amber, DO  Sex: Female Height: 4\' 11"  Ref Phys: Narda Amber, DO  ID#: [id number] Temp: 32.5C Technician: Jerilynn Mages. Dean   Patient Complaints: This is a 60 year-old female referred for evaluation of bilateral feet numbness and pain.  NCV & EMG Findings: Extensive electrodiagnostic testing of the right lower extremity and additional studies of the left shows: 1. Bilateral sural and superficial peroneal sensory responses are within normal limits. 2. Bilateral peroneal and tibial motor responses are within normal limits. 3. Bilateral tibial H reflex studies are within normal limits. 4. There is no evidence of active or chronic motor axon loss changes affecting any of the tested muscles. Motor unit configuration and recruitment pattern is within normal limits.  Impression: This is a normal study of the lower extremities. In particular, there is no evidence of a large fiber sensorimotor polyneuropathy or lumbosacral radiculopathy affecting the lower extremities. However, small fiber neuropathy cannot be excluded by this study.  ___________________________ Narda Amber, DO    Nerve Conduction Studies Anti Sensory Summary Table   Site NR Peak (ms) Norm Peak (ms) P-T Amp (V) Norm P-T Amp  Left Sup Peroneal Anti Sensory (Ant Lat Mall)  33.5C  12 cm    2.7 <4.6 17.0 >4  Right Sup Peroneal Anti Sensory (Ant Lat Mall)  33.5C  12 cm    2.6 <4.6 21.2 >4  Left Sural Anti Sensory (Lat Mall)  33.5C  Calf    3.1 <4.6 26.0 >4  Right Sural Anti Sensory (Lat Mall)  33.5C  Calf    3.1 <4.6 32.7 >4   Motor Summary Table   Site NR Onset (ms) Norm Onset (ms) O-P Amp (mV) Norm O-P Amp Site1 Site2 Delta-0 (ms) Dist (cm) Vel (m/s) Norm Vel (m/s)  Left Peroneal Motor (Ext Dig Brev)  33.5C  Ankle    3.0 <6.0 3.4  >2.5 B Fib Ankle 5.9 33.0 56 >40  B Fib    8.9  3.0  Poplt B Fib 1.6 10.0 63 >40  Poplt    10.5  2.7         Right Peroneal Motor (Ext Dig Brev)  33.5C  Ankle    2.8 <6.0 4.0 >2.5 B Fib Ankle 5.6 27.0 48 >40  B Fib    8.4  3.8  Poplt B Fib 1.8 10.0 56 >40  Poplt    10.2  3.7         Left Tibial Motor (Abd Hall Brev)  33.5C  Ankle    3.9 <6.0 11.2 >4 Knee Ankle 5.9 33.0 56 >40  Knee    9.8  7.2         Right Tibial Motor (Abd Hall Brev)  33.5C  Ankle    3.6 <6.0 10.7 >4 Knee Ankle 7.5 35.0 47 >40  Knee    11.1  8.4          H Reflex Studies   NR H-Lat (ms) Lat Norm (ms) L-R H-Lat (ms)  Left Tibial (Gastroc)  33.5C     29.39 <35 2.18  Right Tibial (Gastroc)  33.5C     27.21 <35 2.18   EMG   Side Muscle Ins Act Fibs Psw Fasc Number Recrt Dur Dur. Amp Amp. Poly Poly. Comment  Right AntTibialis Nml Nml Nml Nml Nml Nml Nml Nml  Nml Nml Nml Nml N/A  Left AntTibialis Nml Nml Nml Nml Nml Nml Nml Nml Nml Nml Nml Nml N/A  Left Gastroc Nml Nml Nml Nml Nml Nml Nml Nml Nml Nml Nml Nml N/A  Left Flex Dig Long Nml Nml Nml Nml Nml Nml Nml Nml Nml Nml Nml Nml N/A  Left RectFemoris Nml Nml Nml Nml Nml Nml Nml Nml Nml Nml Nml Nml N/A  Left GluteusMed Nml Nml Nml Nml Nml Nml Nml Nml Nml Nml Nml Nml N/A  Right Gastroc Nml Nml Nml Nml Nml Nml Nml Nml Nml Nml Nml Nml N/A  Right Flex Dig Long Nml Nml Nml Nml Nml Nml Nml Nml Nml Nml Nml Nml N/A  Right RectFemoris Nml Nml Nml Nml Nml Nml Nml Nml Nml Nml Nml Nml N/A  Right GluteusMed Nml Nml Nml Nml Nml Nml Nml Nml Nml Nml Nml Nml N/A  Right BicepsFemS Nml Nml Nml Nml Nml Nml Nml Nml Nml Nml Nml Nml N/A      Waveforms:

## 2015-11-20 ENCOUNTER — Encounter: Payer: Self-pay | Admitting: *Deleted

## 2015-11-27 ENCOUNTER — Other Ambulatory Visit: Payer: Self-pay | Admitting: *Deleted

## 2015-11-27 ENCOUNTER — Telehealth: Payer: Self-pay | Admitting: Neurology

## 2015-11-27 DIAGNOSIS — R202 Paresthesia of skin: Secondary | ICD-10-CM

## 2015-11-27 NOTE — Telephone Encounter (Signed)
Patient informed that I have placed the order for the vascular study.  She will call them to set up the appointment.

## 2015-11-27 NOTE — Telephone Encounter (Signed)
Called Marissa Grant to set up ABI.  Left message for her to call me back.

## 2015-11-27 NOTE — Telephone Encounter (Signed)
Pt needs to talk to someone about what is the next step after the EMG please call her at [phone]

## 2015-11-27 NOTE — Telephone Encounter (Signed)
Vascular studies are mentioned in your last note.  Please advise.

## 2015-11-27 NOTE — Telephone Encounter (Signed)
Yes, please order vascular studies of the legs. Thanks

## 2015-11-29 ENCOUNTER — Encounter (HOSPITAL_COMMUNITY): Payer: 59

## 2015-12-09 ENCOUNTER — Ambulatory Visit (HOSPITAL_COMMUNITY)
Admission: RE | Admit: 2015-12-09 | Discharge: 2015-12-09 | Disposition: A | Discharge status: Home / Self Care | Payer: 59 | Source: Ambulatory Visit | Attending: Neurology | Admitting: Neurology

## 2015-12-09 DIAGNOSIS — R200 Anesthesia of skin: Secondary | ICD-10-CM | POA: Diagnosis not present

## 2015-12-09 DIAGNOSIS — R209 Unspecified disturbances of skin sensation: Secondary | ICD-10-CM

## 2015-12-09 DIAGNOSIS — R202 Paresthesia of skin: Secondary | ICD-10-CM

## 2016-01-10 DIAGNOSIS — H524 Presbyopia: Secondary | ICD-10-CM | POA: Diagnosis not present

## 2016-03-23 ENCOUNTER — Ambulatory Visit (INDEPENDENT_AMBULATORY_CARE_PROVIDER_SITE_OTHER): Payer: 59 | Admitting: Physician Assistant

## 2016-03-23 ENCOUNTER — Encounter: Payer: Self-pay | Admitting: Physician Assistant

## 2016-03-23 VITALS — BP 106/55 | HR 62 | Temp 98.2°F | Resp 16 | Ht 59.0 in | Wt 119.4 lb

## 2016-03-23 DIAGNOSIS — K219 Gastro-esophageal reflux disease without esophagitis: Secondary | ICD-10-CM | POA: Diagnosis not present

## 2016-03-23 DIAGNOSIS — K5900 Constipation, unspecified: Secondary | ICD-10-CM | POA: Diagnosis not present

## 2016-03-23 MED ORDER — PANTOPRAZOLE SODIUM 40 MG PO TBEC: 40.0000 mg | DELAYED_RELEASE_TABLET | Freq: Every day | ORAL | Status: DC

## 2016-03-23 MED ORDER — LINACLOTIDE 145 MCG PO CAPS
145.0000 ug | ORAL_CAPSULE | Freq: Every day | ORAL | Status: DC
Start: 2016-03-23 — End: 2016-09-03

## 2016-03-23 MED FILL — PANTOPRAZOLE SOD DR 40 MG T: 40 | 30 days supply | Qty: 30 | Fill #0

## 2016-03-23 MED FILL — LINZESS 145 MCG CAPSULE: 145 | 30 days supply | Qty: 30 | Fill #0

## 2016-03-23 NOTE — Patient Instructions (Signed)
Please start the medications as directed. Continue good hydration and fiber intake. Follow the diet below for acid reflux.  Follow-up with Dr. Charlett Blake in 1 month.   Food Choices for Gastroesophageal Reflux Disease, Adult When you have gastroesophageal reflux disease (GERD), the foods you eat and your eating habits are very important. Choosing the right foods can help ease the discomfort of GERD. WHAT GENERAL GUIDELINES DO I NEED TO FOLLOW?  Choose fruits, vegetables, whole grains, low-fat dairy products, and low-fat meat, fish, and poultry.  Limit fats such as oils, salad dressings, butter, nuts, and avocado.  Keep a food diary to identify foods that cause symptoms.  Avoid foods that cause reflux. These may be different for different people.  Eat frequent small meals instead of three large meals each day.  Eat your meals slowly, in a relaxed setting.  Limit fried foods.  Cook foods using methods other than frying.  Avoid drinking alcohol.  Avoid drinking large amounts of liquids with your meals.  Avoid bending over or lying down until 2-3 hours after eating. WHAT FOODS ARE NOT RECOMMENDED? The following are some foods and drinks that may worsen your symptoms: Vegetables Tomatoes. Tomato juice. Tomato and spaghetti sauce. Chili peppers. Onion and garlic. Horseradish. Fruits Oranges, grapefruit, and lemon (fruit and juice). Meats High-fat meats, fish, and poultry. This includes hot dogs, ribs, ham, sausage, salami, and bacon. Dairy Whole milk and chocolate milk. Sour cream. Cream. Butter. Ice cream. Cream cheese.  Beverages Coffee and tea, with or without caffeine. Carbonated beverages or energy drinks. Condiments Hot sauce. Barbecue sauce.  Sweets/Desserts Chocolate and cocoa. Donuts. Peppermint and spearmint. Fats and Oils High-fat foods, including [place] fries and potato chips. Other Vinegar. Strong spices, such as black pepper, white pepper, red pepper, cayenne,  curry powder, cloves, ginger, and chili powder. The items listed above may not be a complete list of foods and beverages to avoid. Contact your dietitian for more information.   This information is not intended to replace advice given to you by your health care provider. Make sure you discuss any questions you have with your health care provider.   Document Released: 08/24/2005 Document Revised: 09/14/2014 Document Reviewed: 06/28/2013 Elsevier Interactive Patient Education [employer].

## 2016-03-23 NOTE — Assessment & Plan Note (Signed)
Will continue supportive measures. Will restart Linzess 145 mcg daily. FU with PCP 1 month.

## 2016-03-23 NOTE — Progress Notes (Signed)
Pre visit review using our clinic review tool, if applicable. No additional management support is needed unless otherwise documented below in the visit note/SLS  

## 2016-03-23 NOTE — Assessment & Plan Note (Signed)
Will start Protonix and probiotic. GERD diet reviewed. FU with PCP in 1 month.

## 2016-03-23 NOTE — Progress Notes (Signed)
    Patient presents to clinic today wanting to discuss restarting Linzess for chronic constipation. Was previously on this medication given by GI Ardis Hughs) using every QOD for bowel movements. Is currently having to take Laxative and prunes daily for bowel movement. Is staying well hydrated and trying to eat a good amount of fiber. Denies abdominal pain, nausea or vomiting. Denies blood in the stool. Patient does endorse recurrent reflux as Dexilant is subtherapeutic  Past Medical History  Diagnosis Date  . Acid reflux   . Chicken pox as a child  . Osteopenia     Current Outpatient Prescriptions on File Prior to Visit  Medication Sig Dispense Refill  . ibuprofen (ADVIL,MOTRIN) 200 MG tablet Take 20-800 mg by mouth every 6 (six) hours as needed. As needed for pain.     No current facility-administered medications on file prior to visit.    Allergies  Allergen Reactions  . Pollen Extract Other (See Comments)    Nasal congestion.     Family History  Problem Relation Age of Onset  . Colon cancer Neg Hx   . Alzheimer's disease Mother   . Breast cancer Neg Hx   . Diabetes Neg Hx   . CAD Neg Hx   . Healthy Daughter     Social History   Social History  . Marital Status: Widowed    Spouse Name: N/A  . Number of Children: 2  . Years of Education: N/A   Occupational History  . nurse tech [employer]   Social History Main Topics  . Smoking status: Never Smoker   . Smokeless tobacco: Never Used  . Alcohol Use: 0.0 oz/week    0 Standard drinks or equivalent per week     Comment: rare  . Drug Use: No  . Sexual Activity: Not Asked   Other Topics Concern  . None   Social History Narrative   Original from 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   Review of Systems - See HPI.  All other ROS are negative.  BP 106/55 mmHg  Pulse 62  Temp(Src) 98.2 F (36.8 C) (Oral)  Resp 16  Ht 4\' 11"  (1.499 m)   Wt 119 lb 6 oz (54.148 kg)  BMI 24.10 kg/m2  SpO2 99%  Physical Exam  Constitutional: She is well-developed, well-nourished, and in no distress.  HENT:  Head: Normocephalic and atraumatic.  Eyes: Conjunctivae are normal.  Neck: Neck supple.  Cardiovascular: Normal rate, regular rhythm, normal heart sounds and intact distal pulses.   Pulmonary/Chest: Effort normal and breath sounds normal. No respiratory distress. She has no wheezes. She has no rales. She exhibits no tenderness.  Abdominal: Soft. Bowel sounds are normal. She exhibits no distension and no mass. There is no tenderness. There is no rebound and no guarding.  Neurological: She is alert.  Skin: Skin is warm and dry. No rash noted.  Psychiatric: Affect normal.  Vitals reviewed.  Assessment/Plan: GERD (gastroesophageal reflux disease) Will start Protonix and probiotic. GERD diet reviewed. FU with PCP in 1 month.  Constipation Will continue supportive measures. Will restart Linzess 145 mcg daily. FU with PCP 1 month.     Leeanne Rio, PA-C

## 2016-05-04 DIAGNOSIS — Z1231 Encounter for screening mammogram for malignant neoplasm of breast: Secondary | ICD-10-CM | POA: Diagnosis not present

## 2016-05-04 DIAGNOSIS — Z853 Personal history of malignant neoplasm of breast: Secondary | ICD-10-CM | POA: Diagnosis not present

## 2016-05-04 LAB — HM MAMMOGRAPHY

## 2016-05-07 ENCOUNTER — Encounter: Payer: Self-pay | Admitting: Internal Medicine

## 2016-09-03 ENCOUNTER — Encounter: Payer: Self-pay | Admitting: Internal Medicine

## 2016-09-03 ENCOUNTER — Ambulatory Visit (INDEPENDENT_AMBULATORY_CARE_PROVIDER_SITE_OTHER): Payer: 59 | Admitting: Internal Medicine

## 2016-09-03 VITALS — BP 118/78 | HR 55 | Temp 98.1°F | Resp 12 | Ht 59.0 in | Wt 124.2 lb

## 2016-09-03 DIAGNOSIS — Z0000 Encounter for general adult medical examination without abnormal findings: Secondary | ICD-10-CM | POA: Diagnosis not present

## 2016-09-03 LAB — LIPID PANEL
CHOL/HDL RATIO: 3
Cholesterol: 185 mg/dL (ref 0–200)
HDL: 55.2 mg/dL (ref >39.00)
LDL CALC: 110 mg/dL — AB (ref 0–99)
NonHDL: 130.26
TRIGLYCERIDES: 102 mg/dL (ref 0.0–149.0)
VLDL: 20.4 mg/dL (ref 0.0–40.0)

## 2016-09-03 LAB — CBC WITH DIFFERENTIAL/PLATELET
BASOS ABS: 0 10*3/uL (ref 0.0–0.1)
Basophils Relative: 0.5 % (ref 0.0–3.0)
EOS ABS: 0 10*3/uL (ref 0.0–0.7)
Eosinophils Relative: 0.6 % (ref 0.0–5.0)
HCT: 42.2 % (ref 36.0–46.0)
Hemoglobin: 14.3 g/dL (ref 12.0–15.0)
LYMPHS ABS: 1.9 10*3/uL (ref 0.7–4.0)
Lymphocytes Relative: 31 % (ref 12.0–46.0)
MCHC: 33.8 g/dL (ref 30.0–36.0)
MCV: 87.8 fl (ref 78.0–100.0)
Monocytes Absolute: 0.3 10*3/uL (ref 0.1–1.0)
Monocytes Relative: 5.2 % (ref 3.0–12.0)
NEUTROS ABS: 3.8 10*3/uL (ref 1.4–7.7)
NEUTROS PCT: 62.7 % (ref 43.0–77.0)
PLATELETS: 237 10*3/uL (ref 150.0–400.0)
RBC: 4.8 Mil/uL (ref 3.87–5.11)
RDW: 12.9 % (ref 11.5–15.5)
WBC: 6.1 10*3/uL (ref 4.0–10.5)

## 2016-09-03 LAB — HEMOGLOBIN A1C: Hgb A1c MFr Bld: 5.8 % (ref 4.6–6.5)

## 2016-09-03 LAB — BASIC METABOLIC PANEL
BUN: 16 mg/dL (ref 6–23)
CHLORIDE: 106 meq/L (ref 96–112)
CO2: 32 meq/L (ref 19–32)
Calcium: 9.4 mg/dL (ref 8.4–10.5)
Creatinine, Ser: 0.66 mg/dL (ref 0.40–1.20)
GFR: 97.43 mL/min (ref >60.00)
Glucose, Bld: 97 mg/dL (ref 70–99)
POTASSIUM: 4.1 meq/L (ref 3.5–5.1)
SODIUM: 142 meq/L (ref 135–145)

## 2016-09-03 NOTE — Progress Notes (Signed)
Pre visit review using our clinic review tool, if applicable. No additional management support is needed unless otherwise documented below in the visit note. 

## 2016-09-03 NOTE — Progress Notes (Signed)
Subjective:    Patient ID: Marissa Grant, female    DOB: October 23, 1955, 60 y.o.   MRN: [id number]  DOS:  09/03/2016 Type of visit - description : cpx Interval history:No major concerns    Review of Systems Constitutional: No fever. No chills. No unexplained wt changes. No unusual sweats  HEENT: No dental problems, no ear discharge, no facial swelling, no voice changes. No eye discharge, no eye  redness , no  intolerance to light   Respiratory: No wheezing , no  difficulty breathing. No cough , no mucus production  Cardiovascular: No CP, no leg swelling , no  Palpitations  GI:  History of dyspepsia, symptoms are stable for years, occasional heartburn and feeling bloated. no vomiting, no diarrhea  No blood in the stools. No dysphagia, no odynophagia    Endocrine: No polyphagia, no polyuria , no polydipsia  GU: No dysuria, gross hematuria, difficulty urinating. occ  urinary urgency, no frequency.  Musculoskeletal: No joint swellings or unusual aches or pains  Skin: No change in the color of the skin, palor , no  Rash  Allergic, immunologic: No environmental allergies , no  food allergies  Neurological: No dizziness no  syncope. No headaches. No diplopia, no slurred, no slurred speech, no motor deficits, no facial  Numbness  Hematological: No enlarged lymph nodes, no easy bruising , no unusual bleedings  Psychiatry: No suicidal ideas, no hallucinations, no beavior problems, no confusion.  No unusual/severe anxiety, no depression   Past Medical History:  Diagnosis Date  . Acid reflux   . Chicken pox as a child  . Osteopenia     Past Surgical History:  Procedure Laterality Date  . ABDOMINAL HYSTERECTOMY  1992   ?oophotrectomy  . Foot surgery - Cotton, Gastroc Left 09/01/2012   1st ray elevatus, Equinus Lt    Social History   Social History  . Marital status: Widowed    Spouse name: N/A  . Number of children: 2  . Years of education: N/A   Occupational History  .  nurse tech [employer]   Social History Main Topics  . Smoking status: Never Smoker  . Smokeless tobacco: Never Used  . Alcohol use 0.0 oz/week     Comment: rare  . Drug use: No  . Sexual activity: Not on file   Other Topics Concern  . 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     Family History  Problem Relation Age of Onset  . Alzheimer's disease Mother   . Healthy Daughter   . Colon cancer Neg Hx   . Breast cancer Neg Hx   . Diabetes Neg Hx   . CAD Neg Hx      Allergies as of 09/03/2016      Reactions   Pollen Extract Other (See Comments)   Nasal congestion.       Medication List    as of 09/03/2016 11:59 PM   You have not been prescribed any medications.        Objective:   Physical Exam BP 118/78 (BP Location: Left Arm, Patient Position: Sitting, Cuff Size: Small)   Pulse (!) 55   Temp 98.1 F (36.7 C) (Oral)   Resp 12   Ht 4\' 11"  (1.499 m)   Wt 124 lb 4 oz (56.4 kg)  SpO2 98%   BMI 25.10 kg/m   General:   Well developed, well nourished . NAD.  Neck: No  thyromegaly  HEENT:  Normocephalic . Face symmetric, atraumatic Lungs:  CTA B Normal respiratory effort, no intercostal retractions, no accessory muscle use. Heart: RRR,  no murmur.  No pretibial edema bilaterally  Abdomen:  Not distended, soft, non-tender. No rebound or rigidity.   Skin: Exposed areas without rash. Not pale. Not jaundice Neurologic:  alert & oriented X3.  Speech normal, gait appropriate for age and unassisted Strength symmetric and appropriate for age.  Psych: Cognition and judgment appear intact.  Cooperative with normal attention span and concentration.  Behavior appropriate. No anxious or depressed appearing.    Assessment & Plan:    Assessment  Hyperglycemia, A1c is 5.7 (2015) GERD-dyspepsia, chronic: saw GI , EGD (-)  2014 Neuropathy: 2016 labs (-), rx gabapentin (mild response but intolerant); NCS 10-2015 (-); ABIs wnl 12-2015  Osteopenia:   Tscore -1.8 (03-2014) , on ca and vit d  PLAN: Hyperglycemia: Diet is pretty good, active at work. Check labs Dyspepsia: At baseline, on no medications. Previous w/u (-). Neuropathy: W/u (-), sx @ baseline, on no medication Osteopenia- She is active, recommend vitamin d. RTC one year

## 2016-09-03 NOTE — Patient Instructions (Signed)
GO TO THE LAB : Get the blood work     GO TO THE FRONT DESK Schedule your next appointment for a  Physical in 1 year  Take Vit D 1000 units daily

## 2016-09-03 NOTE — Assessment & Plan Note (Addendum)
Td 2015.  Had a flu shot Colonoscopy 2011 at [facility], told normal, repeat in 10 years, no documentation Female care--  Sees gynecology Mammogram [phone] negative Diet and exercise discussed Labs:  BMP, FLP, CBC, A1c

## 2016-09-04 NOTE — Assessment & Plan Note (Signed)
Hyperglycemia: Diet is pretty good, active at work. Check labs Dyspepsia: At baseline, on no medications. Previous w/u (-). Neuropathy: W/u (-), sx @ baseline, on no medication Osteopenia- She is active, recommend vitamin d. RTC one year

## 2016-09-30 ENCOUNTER — Telehealth: Payer: Self-pay | Admitting: Internal Medicine

## 2016-09-30 NOTE — Telephone Encounter (Signed)
LMOM informing Pt to avoid foods that causes her gas, OTC GAS-X, Milk of Magnesia, etc. If sx's not improving, recommend she schedule appt to discuss w/ PCP.

## 2016-09-30 NOTE — Telephone Encounter (Signed)
Pt called in because she says that she has gas all of the time. She says that she went to the pharmacy and have been taking something OTC, she cant recall what medication is called. She would like to know if the provider could suggest something for her.   CB: [phone]

## 2017-01-11 DIAGNOSIS — H52222 Regular astigmatism, left eye: Secondary | ICD-10-CM | POA: Diagnosis not present

## 2017-01-11 DIAGNOSIS — H5203 Hypermetropia, bilateral: Secondary | ICD-10-CM | POA: Diagnosis not present

## 2017-01-21 ENCOUNTER — Ambulatory Visit (INDEPENDENT_AMBULATORY_CARE_PROVIDER_SITE_OTHER): Payer: 59 | Admitting: Medical

## 2017-01-21 ENCOUNTER — Encounter: Payer: Self-pay | Admitting: Medical

## 2017-01-21 VITALS — BP 126/55 | HR 62 | Temp 98.3°F | Resp 16 | Ht 59.0 in | Wt 128.0 lb

## 2017-01-21 DIAGNOSIS — R109 Unspecified abdominal pain: Secondary | ICD-10-CM

## 2017-01-21 DIAGNOSIS — N3000 Acute cystitis without hematuria: Secondary | ICD-10-CM

## 2017-01-21 MED ORDER — CIPROFLOXACIN HCL 500 MG PO TABS: 500.0000 mg | ORAL_TABLET | Freq: Two times a day (BID) | ORAL | 0 refills | Status: DC

## 2017-01-21 MED ORDER — METRONIDAZOLE 500 MG PO TABS: 500.0000 mg | ORAL_TABLET | Freq: Three times a day (TID) | ORAL | 0 refills | Status: DC

## 2017-01-21 NOTE — Progress Notes (Signed)
Subjective:    Patient ID: Marissa Grant, female    DOB: 1955/12/28, 61 y.o.   MRN: [id number]  HPI   Pt in today reporting urinary symptoms.(but some left lower abd pain as well)  Dysuria- no Frequent urination- for 3 day. Urinating at least 8 times a day. Hesitancy-no Suprapubic pressure- pt has some pain left of suprpubic area.. Fever- yesterday temp was 99.3.  chills-yes Nausea-mild Vomiting-no CVA pain-no  History of UTI- pt in past occasional uti.  Gross hematuria- no  Pain level is 3/10.  No diarrhea. Pt in past 2011 had colonoscopy. Per pt no diverticulosis.  Pt family pt never had hysterectomy.      Review of Systems  Constitutional: Positive for fever. Negative for chills and fatigue.       See hpi. t max   Respiratory: Negative for cough, chest tightness, shortness of breath and wheezing.   Cardiovascular: Negative for chest pain and palpitations.  Gastrointestinal: Positive for abdominal pain and nausea. Negative for abdominal distention, constipation, diarrhea, rectal pain and vomiting.  Genitourinary: Positive for frequency. Negative for decreased urine volume, difficulty urinating, dysuria, flank pain, pelvic pain, urgency, vaginal bleeding and vaginal pain.  Musculoskeletal: Negative for back pain.  Skin: Negative for rash.  Neurological: Negative for dizziness, speech difficulty, weakness and headaches.  Hematological: Negative for adenopathy. Does not bruise/bleed easily.  Psychiatric/Behavioral: Negative for agitation, confusion and dysphoric mood. The patient is not nervous/anxious.    Past Medical History:  Diagnosis Date  . Acid reflux   . Chicken pox as a child  . Osteopenia      Social History   Social History  . Marital status: Widowed    Spouse name: N/A  . Number of children: 2  . Years of education: N/A   Occupational History  . 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 Smoking status: Never Smoker  . Smokeless  tobacco: Never Used  . Alcohol use 0.0 oz/week     Comment: rare  . Drug use: No  . Sexual activity: Not on file   Other Topics Concern  . 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    Past Surgical History:  Procedure Laterality Date  . ABDOMINAL HYSTERECTOMY  1992   ?oophotrectomy  . Foot surgery - Cotton, Gastroc Left 09/01/2012   1st ray elevatus, Equinus Lt    Family History  Problem Relation Age of Onset  . Alzheimer's disease Mother   . Healthy Daughter   . Colon cancer Neg Hx   . Breast cancer Neg Hx   . Diabetes Neg Hx   . CAD Neg Hx     Allergies  Allergen Reactions  . Pollen Extract Other (See Comments)    Nasal congestion.     No current outpatient prescriptions on file prior to visit.   No current facility-administered medications on file prior to visit.     BP (!) 126/55 (BP Location: Left Arm, Patient Position: Sitting, Cuff Size: Normal)   Pulse 62   Temp 98.3 F (36.8 C) (Oral)   Resp 16   Ht 4\' 11"  (1.499 m)   Wt 128 lb (58.1 kg)   SpO2 99%   BMI 25.85 kg/m       Objective:   Physical Exam  General Appearance-  Not in acute distress.  HEENT Eyes- Scleraeral/Conjuntiva-bilat- Not Yellow. Mouth & Throat- Normal.  Chest and Lung Exam Auscultation: Breath sounds:-Normal. Adventitious sounds:- No Adventitious sounds.  Cardiovascular Auscultation:Rythm - Regular. Heart Sounds -Normal heart sounds.  Abdomen Inspection:-Inspection Normal.  Palpation/Perucssion: Palpation and Percussion of the abdomen reveal- faint llq Tender, No Rebound tenderness, No rigidity(Guarding) and No Palpable abdominal masses.  Liver:-Normal.  Spleen:- Normal.  No mid suprapubic pain  Back- no cva tenderness.      Assessment & Plan:  For possible uti and some concern for possible diverticulitis will rx both cipro and  flagyl.(Lt lower quadrant region of pain causes concern for diverticulitis).  We will follow you urine culture and let you know results when they are in.  Hydrate well. Can take ibuprofen low dose for pain.  If your culture is negative and pain persist will need to consider imagine studies either [place] or ABD/Pelvis CT. In event any severe increase in pain then ED evaluation.  Follow up in 5 days or as needed  Clearance Chenault, Percell Miller, [employer]

## 2017-01-21 NOTE — Patient Instructions (Signed)
For possible uti and some concern for possible diverticulitis will rx both cipro and flagyl.(Lt lower quadrant region of pain causes concern for diverticulitis).  We will follow you urine culture and let you know results when they are in.  Hydrate well. Can take ibuprofen low dose for pain.  If your culture is negative and pain persist will need to consider imagine studies either [place] or ABD/Pelvis CT. In event any severe increase in pain then ED evaluation.  Follow up in 5 days or as needed

## 2017-01-22 DIAGNOSIS — N3000 Acute cystitis without hematuria: Secondary | ICD-10-CM | POA: Diagnosis not present

## 2017-01-22 MED FILL — CIPROFLOXACIN HCL 500 MG TA: 500 | 7 days supply | Qty: 14 | Fill #0

## 2017-01-22 MED FILL — metroNIDAZOLE 500 MG TABS: 500 | 7 days supply | Qty: 21 | Fill #0

## 2017-01-23 LAB — URINE CULTURE: Organism ID, Bacteria: NO GROWTH

## 2017-05-05 DIAGNOSIS — Z1231 Encounter for screening mammogram for malignant neoplasm of breast: Secondary | ICD-10-CM | POA: Diagnosis not present

## 2017-05-05 LAB — HM MAMMOGRAPHY

## 2017-05-11 ENCOUNTER — Encounter: Payer: Self-pay | Admitting: Internal Medicine

## 2017-07-07 DIAGNOSIS — N898 Other specified noninflammatory disorders of vagina: Secondary | ICD-10-CM | POA: Diagnosis not present

## 2017-07-07 DIAGNOSIS — N760 Acute vaginitis: Secondary | ICD-10-CM | POA: Diagnosis not present

## 2017-07-07 DIAGNOSIS — Z01411 Encounter for gynecological examination (general) (routine) with abnormal findings: Secondary | ICD-10-CM | POA: Diagnosis not present

## 2017-07-07 DIAGNOSIS — L900 Lichen sclerosus et atrophicus: Secondary | ICD-10-CM | POA: Insufficient documentation

## 2017-07-07 DIAGNOSIS — B9689 Other specified bacterial agents as the cause of diseases classified elsewhere: Secondary | ICD-10-CM | POA: Diagnosis not present

## 2017-07-07 MED FILL — metroNIDAZOLE 500 MG TABS: 500 | 7 days supply | Qty: 14 | Fill #0

## 2017-07-07 MED FILL — CLOBETASOL 0.05% OINTMENT: 0.05 | 20 days supply | Qty: 30 | Fill #0

## 2017-07-13 DIAGNOSIS — M549 Dorsalgia, unspecified: Secondary | ICD-10-CM | POA: Diagnosis not present

## 2017-07-13 DIAGNOSIS — I739 Peripheral vascular disease, unspecified: Secondary | ICD-10-CM | POA: Diagnosis not present

## 2017-07-13 DIAGNOSIS — M546 Pain in thoracic spine: Secondary | ICD-10-CM | POA: Diagnosis not present

## 2017-07-19 ENCOUNTER — Other Ambulatory Visit (HOSPITAL_COMMUNITY): Payer: Self-pay | Admitting: Neurosurgery

## 2017-07-19 DIAGNOSIS — I739 Peripheral vascular disease, unspecified: Secondary | ICD-10-CM

## 2017-07-23 ENCOUNTER — Ambulatory Visit (HOSPITAL_COMMUNITY): Payer: 59

## 2017-09-06 ENCOUNTER — Telehealth: Payer: Self-pay | Admitting: Internal Medicine

## 2017-09-06 ENCOUNTER — Encounter: Payer: Self-pay | Admitting: Internal Medicine

## 2017-09-06 ENCOUNTER — Ambulatory Visit (INDEPENDENT_AMBULATORY_CARE_PROVIDER_SITE_OTHER): Payer: 59 | Admitting: Internal Medicine

## 2017-09-06 VITALS — BP 118/80 | HR 57 | Temp 97.7°F | Resp 14 | Ht 59.0 in | Wt 128.5 lb

## 2017-09-06 DIAGNOSIS — Z0000 Encounter for general adult medical examination without abnormal findings: Secondary | ICD-10-CM | POA: Diagnosis not present

## 2017-09-06 DIAGNOSIS — Z1159 Encounter for screening for other viral diseases: Secondary | ICD-10-CM

## 2017-09-06 NOTE — Patient Instructions (Signed)
GO TO THE LAB : Get the blood work     GO TO THE FRONT DESK Schedule your next appointment for a checkup in 1 year   Consider calcium 1 g daily and vitamin D 800 units a day approximately.

## 2017-09-06 NOTE — Assessment & Plan Note (Addendum)
-  Td 2015.  Had a flu shot -CCS: Colonoscopy 2011 at [facility], told normal, repeat in 10 years, no documentation.  Request Hemoccult card, provided. -Female care:  Sees gynecology, clinical breast exam neg @ gyn,  Mammogram 04-2017 negative -Diet and exercise discussed - Labs: CMP, CBC, TSH, hep C, IFOB

## 2017-09-06 NOTE — Progress Notes (Signed)
Pre visit review using our clinic review tool, if applicable. No additional management support is needed unless otherwise documented below in the visit note. 

## 2017-09-06 NOTE — Progress Notes (Signed)
Subjective:    Patient ID: Marissa Grant, female    DOB: 05/05/1956, 61 y.o.   MRN: [id number]  DOS:  09/06/2017 Type of visit - description : cpx Interval history:  Has few concerns, see below   Review of Systems Continue with paresthesias as before Has occasionally nocturia x2, and urinary frequency.  No dysuria or gross hematuria.  Symptoms going on for a year  Other than above, a 14 point review of systems is negative     Past Medical History:  Diagnosis Date  . Acid reflux   . Chicken pox as a child  . Osteopenia     Past Surgical History:  Procedure Laterality Date  . ABDOMINAL HYSTERECTOMY  1992   ?oophotrectomy  . Foot surgery - Cotton, Gastroc Left 09/01/2012   1st ray elevatus, Equinus Lt    Social History   Socioeconomic History  . Marital status: Widowed    Spouse name: Not on file  . Number of children: 2  . Years of education: Not on file  . Highest education level: Not on file  Social Needs  . Financial resource strain: Not on file  . Food insecurity - worry: Not on file  . Food insecurity - inability: Not on file  . Transportation needs - medical: Not on file  . Transportation needs - non-medical: Not on file  Occupational History  . Occupation: Research scientist (life sciences): [employer]  Tobacco Use  . Smoking status: Never Smoker  . Smokeless tobacco: Never Used  Substance and Sexual Activity  . Alcohol use: Yes    Alcohol/week: 0.0 oz    Comment: rare  . Drug use: No  . Sexual activity: Not on file  Other Topics Concern  . 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     Family History  Problem Relation Age of Onset  . Alzheimer's disease Mother   . Healthy Daughter   . Colon cancer Neg Hx   . Breast cancer Neg Hx   . Diabetes Neg Hx   . CAD Neg Hx      Allergies as of 09/06/2017      Reactions   Pollen  Extract Other (See Comments)   Nasal congestion.       Medication List    as of 09/06/2017  1:06 PM   You have not been prescribed any medications.        Objective:   Physical Exam BP 118/80 (BP Location: Left Arm, Patient Position: Sitting, Cuff Size: Small)   Pulse (!) 57   Temp 97.7 F (36.5 C) (Oral)   Resp 14   Ht 4\' 11"  (1.499 m)   Wt 128 lb 8 oz (58.3 kg)   SpO2 98%   BMI 25.95 kg/m  General:   Well developed, well nourished . NAD.  Neck: No  thyromegaly  HEENT:  Normocephalic . Face symmetric, atraumatic Lungs:  CTA B Normal respiratory effort, no intercostal retractions, no accessory muscle use. Heart: RRR,  no murmur.  No pretibial edema bilaterally  Abdomen:  Not distended, soft, non-tender. No rebound or rigidity.   Skin: Exposed areas without rash. Not pale. Not jaundice Neurologic:  alert & oriented X3.  Speech normal, gait appropriate for age and unassisted Strength symmetric and appropriate for age.  Psych: Cognition and  judgment appear intact.  Cooperative with normal attention span and concentration.  Behavior appropriate. No anxious or depressed appearing.     Assessment & Plan:    Assessment  Hyperglycemia, A1c is 5.7 (2015) GERD-dyspepsia, chronic: saw GI , EGD (-) 2014 Neuropathy: 2016 labs (-), rx gabapentin (mild response but intolerant); saw neuro 2017;  NCS 10-2015 (-); ABIs wnl 12-2015; saw neuro surgery 12/28, rec MRI, not done d/t cost Osteopenia:   Tscore -1.8 (03-2014) , on ca and vit d  PLAN: Hyperglycemia: Stable for 3 or 4 years, recheck on RTC Neuropathy, paresthesias: Ongoing problem, recently saw neurosurgery, they recommended a MRI, not done d/t cost.  We talk about Lyrica but she is not really interested at this time. Rec patient to consider see neurology again. Osteopenia: Recheck T Score in 1 or 2 years.  Encouraged exercise, calcium and vitamin D supplements although she has a healthy diet. LUTS: Mild, for more than  a year, urine culture few months a month negative, Rx observation.  Notice she had vaginitis, was treated by gynecology but LUTS sxs did not improve. RTC 1 year

## 2017-09-06 NOTE — Assessment & Plan Note (Signed)
Hyperglycemia: Stable for 3 or 4 years, recheck on RTC Neuropathy, paresthesias: Ongoing problem, recently saw neurosurgery, they recommended a MRI, not done d/t cost.  We talk about Lyrica but she is not really interested at this time. Rec patient to consider see neurology again. Osteopenia: Recheck T Score in 1 or 2 years.  Encouraged exercise, calcium and vitamin D supplements although she has a healthy diet. LUTS: Mild, for more than a year, urine culture few months a month negative, Rx observation.  Notice she had vaginitis, was treated by gynecology but LUTS sxs did not improve. RTC 1 year

## 2017-09-06 NOTE — Telephone Encounter (Signed)
Pt says that she was seen for frequent urination. Pt says that she thought that Dr. Larose Kells sent in something for her to the pharmacy, pt says that she went to pharmacy and didn't see a Rx. Pt would like to be advised. Should there be a Rx at the pharmacy for her?   Please advise.

## 2017-09-07 LAB — COMPREHENSIVE METABOLIC PANEL
AG Ratio: 1.4 (calc) (ref 1.0–2.5)
ALT: 16 U/L (ref 6–29)
AST: 18 U/L (ref 10–35)
Albumin: 4.3 g/dL (ref 3.6–5.1)
Alkaline phosphatase (APISO): 69 U/L (ref 33–130)
BUN: 14 mg/dL (ref 7–25)
CO2: 27 mmol/L (ref 20–32)
CREATININE: 0.73 mg/dL (ref 0.50–1.05)
Calcium: 9.4 mg/dL (ref 8.6–10.4)
Chloride: 108 mmol/L (ref 98–110)
GLUCOSE: 109 mg/dL — AB (ref 65–99)
Globulin: 3 g/dL (calc) (ref 1.9–3.7)
Potassium: 4.4 mmol/L (ref 3.5–5.3)
SODIUM: 144 mmol/L (ref 135–146)
Total Bilirubin: 0.5 mg/dL (ref 0.2–1.2)
Total Protein: 7.3 g/dL (ref 6.1–8.1)

## 2017-09-07 LAB — CBC WITH DIFFERENTIAL/PLATELET
BASOS PCT: 0.5 %
Basophils Absolute: 30 cells/uL (ref 0–200)
EOS ABS: 78 {cells}/uL (ref 15–500)
Eosinophils Relative: 1.3 %
HCT: 42.4 % (ref 35.0–45.0)
Hemoglobin: 14.4 g/dL (ref 11.7–15.5)
Lymphs Abs: 1764 cells/uL (ref 850–3900)
MCH: 28.9 pg (ref 27.0–33.0)
MCHC: 34 g/dL (ref 32.0–36.0)
MCV: 85.1 fL (ref 80.0–100.0)
MPV: 11.2 fL (ref 7.5–12.5)
Monocytes Relative: 5 %
NEUTROS PCT: 63.8 %
Neutro Abs: 3828 cells/uL (ref 1500–7800)
PLATELETS: 285 10*3/uL (ref 140–400)
RBC: 4.98 10*6/uL (ref 3.80–5.10)
RDW: 12.6 % (ref 11.0–15.0)
TOTAL LYMPHOCYTE: 29.4 %
WBC: 6 10*3/uL (ref 3.8–10.8)
WBCMIX: 300 {cells}/uL (ref 200–950)

## 2017-09-07 LAB — TSH: TSH: 2.43 m[IU]/L (ref 0.40–4.50)

## 2017-09-07 LAB — HEPATITIS C ANTIBODY
Hepatitis C Ab: NONREACTIVE
SIGNAL TO CUT-OFF: 0.02 (ref <1.00)

## 2017-09-08 NOTE — Telephone Encounter (Signed)
Please advise 

## 2017-09-08 NOTE — Telephone Encounter (Signed)
If I remember correctly, we decide observation only.  If she has severe symptoms okay to bring the urine sample for a UA urine culture, dx LUTS, will consider antibiotics

## 2017-09-08 NOTE — Telephone Encounter (Signed)
Spoke w/ Pt, informed of PCP thoughts/recommendations. Pt verbalized understanding and will let us know if symptoms become worse.

## 2017-10-05 ENCOUNTER — Other Ambulatory Visit (INDEPENDENT_AMBULATORY_CARE_PROVIDER_SITE_OTHER): Payer: 59

## 2017-10-05 DIAGNOSIS — Z0000 Encounter for general adult medical examination without abnormal findings: Secondary | ICD-10-CM

## 2017-10-05 LAB — FECAL OCCULT BLOOD, IMMUNOCHEMICAL: Fecal Occult Bld: NEGATIVE

## 2017-11-18 ENCOUNTER — Other Ambulatory Visit (HOSPITAL_COMMUNITY): Payer: Self-pay | Admitting: Neurosurgery

## 2017-11-18 DIAGNOSIS — I739 Peripheral vascular disease, unspecified: Secondary | ICD-10-CM

## 2017-12-01 ENCOUNTER — Ambulatory Visit (HOSPITAL_COMMUNITY)
Admission: RE | Admit: 2017-12-01 | Discharge: 2017-12-01 | Disposition: A | Discharge status: Home / Self Care | Payer: 59 | Source: Ambulatory Visit | Attending: Neurosurgery | Admitting: Neurosurgery

## 2017-12-01 DIAGNOSIS — I739 Peripheral vascular disease, unspecified: Secondary | ICD-10-CM | POA: Diagnosis not present

## 2017-12-01 DIAGNOSIS — M47816 Spondylosis without myelopathy or radiculopathy, lumbar region: Secondary | ICD-10-CM | POA: Insufficient documentation

## 2017-12-01 DIAGNOSIS — M545 Low back pain: Secondary | ICD-10-CM | POA: Diagnosis not present

## 2017-12-07 DIAGNOSIS — R208 Other disturbances of skin sensation: Secondary | ICD-10-CM | POA: Diagnosis not present

## 2017-12-07 DIAGNOSIS — Z6826 Body mass index (BMI) 26.0-26.9, adult: Secondary | ICD-10-CM | POA: Diagnosis not present

## 2017-12-07 DIAGNOSIS — R030 Elevated blood-pressure reading, without diagnosis of hypertension: Secondary | ICD-10-CM | POA: Diagnosis not present

## 2017-12-07 DIAGNOSIS — M5136 Other intervertebral disc degeneration, lumbar region: Secondary | ICD-10-CM | POA: Diagnosis not present

## 2018-01-10 DIAGNOSIS — H5202 Hypermetropia, left eye: Secondary | ICD-10-CM | POA: Diagnosis not present

## 2018-01-10 DIAGNOSIS — H52222 Regular astigmatism, left eye: Secondary | ICD-10-CM | POA: Diagnosis not present

## 2018-01-10 DIAGNOSIS — H524 Presbyopia: Secondary | ICD-10-CM | POA: Diagnosis not present

## 2018-05-06 DIAGNOSIS — Z1231 Encounter for screening mammogram for malignant neoplasm of breast: Secondary | ICD-10-CM | POA: Diagnosis not present

## 2018-05-06 LAB — HM MAMMOGRAPHY

## 2018-05-12 ENCOUNTER — Encounter: Payer: Self-pay | Admitting: Internal Medicine

## 2018-07-14 DIAGNOSIS — L900 Lichen sclerosus et atrophicus: Secondary | ICD-10-CM | POA: Diagnosis not present

## 2018-07-14 DIAGNOSIS — Z01419 Encounter for gynecological examination (general) (routine) without abnormal findings: Secondary | ICD-10-CM | POA: Diagnosis not present

## 2018-07-15 MED FILL — CLOBETASOL PROP 0.05% OINT: 0.05 | 20 days supply | Qty: 30 | Fill #0

## 2018-09-09 ENCOUNTER — Ambulatory Visit (HOSPITAL_BASED_OUTPATIENT_CLINIC_OR_DEPARTMENT_OTHER)
Admission: RE | Admit: 2018-09-09 | Discharge: 2018-09-09 | Disposition: A | Discharge status: Home / Self Care | Payer: 59 | Source: Ambulatory Visit | Attending: Internal Medicine | Admitting: Internal Medicine

## 2018-09-09 ENCOUNTER — Ambulatory Visit (INDEPENDENT_AMBULATORY_CARE_PROVIDER_SITE_OTHER): Payer: 59 | Admitting: Internal Medicine

## 2018-09-09 ENCOUNTER — Encounter: Payer: Self-pay | Admitting: Internal Medicine

## 2018-09-09 VITALS — BP 116/80 | HR 59 | Temp 97.9°F | Resp 16 | Ht 59.0 in | Wt 131.1 lb

## 2018-09-09 DIAGNOSIS — M8580 Other specified disorders of bone density and structure, unspecified site: Secondary | ICD-10-CM | POA: Diagnosis not present

## 2018-09-09 DIAGNOSIS — Z0000 Encounter for general adult medical examination without abnormal findings: Secondary | ICD-10-CM

## 2018-09-09 DIAGNOSIS — B351 Tinea unguium: Secondary | ICD-10-CM

## 2018-09-09 DIAGNOSIS — R399 Unspecified symptoms and signs involving the genitourinary system: Secondary | ICD-10-CM | POA: Diagnosis not present

## 2018-09-09 DIAGNOSIS — M8589 Other specified disorders of bone density and structure, multiple sites: Secondary | ICD-10-CM | POA: Diagnosis not present

## 2018-09-09 LAB — CBC WITH DIFFERENTIAL/PLATELET
BASOS ABS: 0 10*3/uL (ref 0.0–0.1)
Basophils Relative: 0.5 % (ref 0.0–3.0)
EOS ABS: 0.1 10*3/uL (ref 0.0–0.7)
Eosinophils Relative: 2 % (ref 0.0–5.0)
HCT: 42.7 % (ref 36.0–46.0)
Hemoglobin: 14.1 g/dL (ref 12.0–15.0)
LYMPHS ABS: 1.7 10*3/uL (ref 0.7–4.0)
Lymphocytes Relative: 28.8 % (ref 12.0–46.0)
MCHC: 33.1 g/dL (ref 30.0–36.0)
MCV: 90.1 fl (ref 78.0–100.0)
MONOS PCT: 5.6 % (ref 3.0–12.0)
Monocytes Absolute: 0.3 10*3/uL (ref 0.1–1.0)
NEUTROS ABS: 3.8 10*3/uL (ref 1.4–7.7)
NEUTROS PCT: 63.1 % (ref 43.0–77.0)
Platelets: 237 10*3/uL (ref 150.0–400.0)
RBC: 4.73 Mil/uL (ref 3.87–5.11)
RDW: 13.3 % (ref 11.5–15.5)
WBC: 6 10*3/uL (ref 4.0–10.5)

## 2018-09-09 LAB — COMPREHENSIVE METABOLIC PANEL
ALK PHOS: 58 U/L (ref 39–117)
ALT: 15 U/L (ref 0–35)
AST: 15 U/L (ref 0–37)
Albumin: 3.9 g/dL (ref 3.5–5.2)
BILIRUBIN TOTAL: 0.7 mg/dL (ref 0.2–1.2)
BUN: 19 mg/dL (ref 6–23)
CO2: 30 meq/L (ref 19–32)
CREATININE: 0.72 mg/dL (ref 0.40–1.20)
Calcium: 9.5 mg/dL (ref 8.4–10.5)
Chloride: 109 mEq/L (ref 96–112)
GFR: 87.52 mL/min (ref >60.00)
Glucose, Bld: 99 mg/dL (ref 70–99)
Potassium: 5.1 mEq/L (ref 3.5–5.1)
Sodium: 145 mEq/L (ref 135–145)
TOTAL PROTEIN: 6.4 g/dL (ref 6.0–8.3)

## 2018-09-09 LAB — LIPID PANEL
Cholesterol: 154 mg/dL (ref 0–200)
HDL: 57.4 mg/dL (ref >39.00)
LDL Cholesterol: 80 mg/dL (ref 0–99)
NonHDL: 96.99
Total CHOL/HDL Ratio: 3
Triglycerides: 86 mg/dL (ref 0.0–149.0)
VLDL: 17.2 mg/dL (ref 0.0–40.0)

## 2018-09-09 LAB — HEMOGLOBIN A1C: HEMOGLOBIN A1C: 5.8 % (ref 4.6–6.5)

## 2018-09-09 LAB — URINALYSIS, ROUTINE W REFLEX MICROSCOPIC
Bilirubin Urine: NEGATIVE
Hgb urine dipstick: NEGATIVE
Ketones, ur: NEGATIVE
Nitrite: NEGATIVE
PH: 6 (ref 5.0–8.0)
RBC / HPF: NONE SEEN (ref 0–?)
Specific Gravity, Urine: 1.02 (ref 1.000–1.030)
Total Protein, Urine: NEGATIVE
Urine Glucose: NEGATIVE
Urobilinogen, UA: 0.2 (ref 0.0–1.0)

## 2018-09-09 NOTE — Assessment & Plan Note (Addendum)
-  Td 2015.  Had a flu shot -CCS: Colonoscopy 2011 at [facility], told normal, repeat in 10 years, no documentation.   Hemoccult card (-) 09/2017 per pt request. Declined iFOB today -Female care:  Sees gynecology ; last MMG 04/2018 -Diet and exercise discussed - Labs: CMP, FLP, CBC, A1c.  UA urine culture

## 2018-09-09 NOTE — Progress Notes (Signed)
Pre visit review using our clinic review tool, if applicable. No additional management support is needed unless otherwise documented below in the visit not

## 2018-09-09 NOTE — Progress Notes (Signed)
Subjective:    Patient ID: Marissa Grant, female    DOB: 1955-10-13, 63 y.o.   MRN: [id number]  DOS:  09/09/2018 Type of visit - description: CPX  Since the last office visit is feeling well.  She has some chronic concerns.  See below  Review of Systems Has thick nails and chronic feet skin irritation.  Thinks is a fungus and request treatment. LUTS: Continue with urinary frequency on and off, no dysuria, gross hematuria difficulty urinating Continue with lower extremity paresthesias, no better or worse than before Occasionally left eye is irritated, has seen an eye doctor already  Other than above, a 14 point review of systems is negative    Past Medical History:  Diagnosis Date  . Acid reflux   . Chicken pox as a child  . Osteopenia     Past Surgical History:  Procedure Laterality Date  . ABDOMINAL HYSTERECTOMY  1992   ?oophotrectomy  . Foot surgery - Cotton, Gastroc Left 09/01/2012   1st ray elevatus, Equinus Lt    Social History   Socioeconomic History  . Marital status: Widowed    Spouse name: Not on file  . Number of children: 2  . Years of education: Not on file  . Highest education level: Not on file  Occupational History  . Occupation: Research scientist (life sciences): [employer]  . Financial resource strain: Not on file  . Food insecurity:    Worry: Not on file    Inability: Not on file  . Transportation needs:    Medical: Not on file    Non-medical: Not on file  Tobacco Use  . Smoking status: Never Smoker  . Smokeless tobacco: Never Used  Substance and Sexual Activity  . Alcohol use: Yes    Alcohol/week: 0.0 standard drinks    Comment: rare  . Drug use: No  . Sexual activity: Not on file  Lifestyle  . Physical activity:    Days per week: Not on file    Minutes per session: Not on file  . Stress: Not on file  Relationships  . Social connections:    Talks on phone: Not on file    Gets together: Not on file    Attends religious service:  Not on file    Active member of club or organization: Not on file    Attends meetings of clubs or organizations: Not on file    Relationship status: Not on file  . Intimate partner violence:    Fear of current or ex partner: Not on file    Emotionally abused: Not on file    Physically abused: Not on file    Forced sexual activity: Not on file  Other Topics Concern  . 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   Family History  Problem Relation Age of Onset  . Alzheimer's disease Mother   . Healthy Daughter   . Colon cancer Neg Hx   . Breast cancer Neg Hx   . Diabetes Neg Hx   . CAD Neg Hx       Allergies as of 09/09/2018      Reactions   Pollen Extract Other (See Comments)   Nasal congestion.       Medication List    as of September 09, 2018  11:59 PM   You have not been prescribed any medications.         Objective:   Physical Exam BP 116/80 (BP Location: Left Arm, Patient Position: Sitting, Cuff Size: Small)   Pulse (!) 59   Temp 97.9 F (36.6 C) (Oral)   Resp 16   Ht 4\' 11"  (1.499 m)   Wt 131 lb 2 oz (59.5 kg)   SpO2 97%   BMI 26.48 kg/m  General: Well developed, NAD, BMI noted Neck: No  thyromegaly  HEENT:  Normocephalic . Face symmetric, atraumatic Lungs:  CTA B Normal respiratory effort, no intercostal retractions, no accessory muscle use. Heart: RRR,  no murmur.  No pretibial edema bilaterally  Abdomen:  Not distended, soft, non-tender. No rebound or rigidity.   Skin: Toenails dystrophic particularly the R great toe.  Feet skin is very dry/scaly  in a moccasin fashion. Neurologic:  alert & oriented X3.  Speech normal, gait appropriate for age and unassisted Strength symmetric and appropriate for age.  Psych: Cognition and judgment appear intact.  Cooperative with normal attention span and concentration.  Behavior  appropriate. No anxious or depressed appearing.     Assessment     Assessment  Hyperglycemia, A1c is 5.7 (2015) GERD-dyspepsia, chronic: saw GI , EGD (-) 2014 Neuropathy: 2016 labs (-), rx gabapentin (mild response but intolerant); saw neuro 2017;  NCS 10-2015 (-); ABIs wnl 12-2015; saw neuro surgery 12/28, 11/2017 MRI: mild DJD Osteopenia:   Tscore -1.8 (03-2014) , on ca and vit d  PLAN: Hyperglycemia: Checking A1c Neuropathy: Since the last office visit, she proceeded with an MRI of the back, mild DJD.  Symptoms unchanged. Osteopenia: Last DEXA 5 years ago.  Normal vitamin D is, recheck a bone density test. LUTS: Chronic, recently saw gynecology, no etiology found.  Check a UA, urine culture. Dystrophic nail, moccasin dermatitis: Most likely fungal, check sample of the right great toe for KOH and culture (pt bleed a little, warned about infex).  Further advised with results. Aware that if oral treatment is needed, there is a risk of liver disease, she is willing to proceed. RTC 3 months

## 2018-09-09 NOTE — Patient Instructions (Signed)
GO TO THE LAB : Get the blood work     GO TO THE FRONT DESK Schedule your next appointment for a checkup in 3 months  We are scheduling a bone density test  We will wait to see the results of the nail sample before we treat you

## 2018-09-10 LAB — URINE CULTURE
MICRO NUMBER:: 8641
Result:: NO GROWTH
SPECIMEN QUALITY:: ADEQUATE

## 2018-09-11 NOTE — Assessment & Plan Note (Signed)
Hyperglycemia: Checking A1c Neuropathy: Since the last office visit, she proceeded with an MRI of the back, mild DJD.  Symptoms unchanged. Osteopenia: Last DEXA 5 years ago.  Normal vitamin D is, recheck a bone density test. LUTS: Chronic, recently saw gynecology, no etiology found.  Check a UA, urine culture. Dystrophic nail, moccasin dermatitis: Most likely fungal, check sample of the right great toe for KOH and culture (pt bleed a little, warned about infex).  Further advised with results. Aware that if oral treatment is needed, there is a risk of liver disease, she is willing to proceed. RTC 3 months

## 2018-09-14 MED ORDER — TERBINAFINE HCL 250 MG PO TABS: 250.0000 mg | ORAL_TABLET | Freq: Every day | ORAL | 0 refills | Status: DC

## 2018-09-14 MED FILL — TERBINAFINE HCL 250 MG TAB: 250 | 30 days supply | Qty: 30 | Fill #0

## 2018-09-14 NOTE — Addendum Note (Signed)
Addended byDamita Dunnings D on: 09/14/2018 09:43 AM   Modules accepted: Orders

## 2018-09-19 LAB — CULT, FUNGUS, SKIN,HAIR,NAIL W/KOH
MICRO NUMBER:: 8509
SPECIMEN QUALITY:: ADEQUATE

## 2018-09-30 ENCOUNTER — Other Ambulatory Visit (INDEPENDENT_AMBULATORY_CARE_PROVIDER_SITE_OTHER): Payer: 59

## 2018-09-30 DIAGNOSIS — B351 Tinea unguium: Secondary | ICD-10-CM

## 2018-09-30 LAB — HEPATIC FUNCTION PANEL
ALT: 48 U/L — ABNORMAL HIGH (ref 0–35)
AST: 36 U/L (ref 0–37)
Albumin: 4.1 g/dL (ref 3.5–5.2)
Alkaline Phosphatase: 56 U/L (ref 39–117)
Bilirubin, Direct: 0.1 mg/dL (ref 0.0–0.3)
TOTAL PROTEIN: 6.8 g/dL (ref 6.0–8.3)
Total Bilirubin: 0.6 mg/dL (ref 0.2–1.2)

## 2018-10-03 MED ORDER — TERBINAFINE HCL 250 MG PO TABS: 250.0000 mg | ORAL_TABLET | Freq: Every day | ORAL | 0 refills | Status: DC

## 2018-10-03 NOTE — Addendum Note (Signed)
Addended byDamita Dunnings D on: 10/03/2018 04:54 PM   Modules accepted: Orders

## 2018-10-24 ENCOUNTER — Other Ambulatory Visit (INDEPENDENT_AMBULATORY_CARE_PROVIDER_SITE_OTHER): Payer: 59

## 2018-10-24 DIAGNOSIS — B351 Tinea unguium: Secondary | ICD-10-CM

## 2018-10-24 LAB — HEPATIC FUNCTION PANEL
ALT: 14 U/L (ref 0–35)
AST: 15 U/L (ref 0–37)
Albumin: 4.3 g/dL (ref 3.5–5.2)
Alkaline Phosphatase: 68 U/L (ref 39–117)
Bilirubin, Direct: 0.1 mg/dL (ref 0.0–0.3)
Total Bilirubin: 0.6 mg/dL (ref 0.2–1.2)
Total Protein: 6.9 g/dL (ref 6.0–8.3)

## 2018-10-25 MED ORDER — TERBINAFINE HCL 250 MG PO TABS: 250.0000 mg | ORAL_TABLET | Freq: Every day | ORAL | 0 refills | Status: DC

## 2018-10-25 NOTE — Addendum Note (Signed)
Addended byDamita Dunnings D on: 10/25/2018 04:54 PM   Modules accepted: Orders

## 2018-12-09 ENCOUNTER — Ambulatory Visit (INDEPENDENT_AMBULATORY_CARE_PROVIDER_SITE_OTHER): Payer: 59 | Admitting: Internal Medicine

## 2018-12-09 ENCOUNTER — Other Ambulatory Visit: Payer: Self-pay

## 2018-12-09 DIAGNOSIS — K219 Gastro-esophageal reflux disease without esophagitis: Secondary | ICD-10-CM | POA: Diagnosis not present

## 2018-12-09 DIAGNOSIS — F419 Anxiety disorder, unspecified: Secondary | ICD-10-CM | POA: Diagnosis not present

## 2018-12-09 MED ORDER — PANTOPRAZOLE SODIUM 40 MG PO TBEC: 40.0000 mg | DELAYED_RELEASE_TABLET | Freq: Every day | ORAL | 3 refills | Status: DC

## 2018-12-09 MED FILL — PANTOPRAZOLE SOD DR 40 MG T: 40 | 90 days supply | Qty: 90 | Fill #0

## 2018-12-09 NOTE — Progress Notes (Signed)
Subjective:    Patient ID: Marissa Grant, female    DOB: 11-Mar-1956, 63 y.o.   MRN: [id number]  DOS:  12/09/2018 Type of visit - description: Virtual Visit via Video Note  I connected with@ on 12/09/18 at  8:20 AM EDT by a video enabled telemedicine application and verified that I am speaking with the correct person using two identifiers.   THIS ENCOUNTER IS A VIRTUAL VISIT DUE TO COVID-19 - PATIENT WAS NOT SEEN IN THE OFFICE. PATIENT HAS CONSENTED TO VIRTUAL VISIT / TELEMEDICINE VISIT   Location of patient: home  Location of provider: office  I discussed the limitations of evaluation and management by telemedicine and the availability of in person appointments. The patient expressed understanding and agreed to proceed.  History of Present Illness: Acute visit Symptoms started approximately 10 days ago with epigastric burning and occasional heartburn.  She has a history of GERD, currently not taking medication and she has been previously asymptomatic for a while. No nausea, vomiting, diarrhea.  No change in the color of the stools.  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  She follows all CDC coronavirus precautions.   Review of Systems Denies fever, chills or myalgias No chest pain or difficulty breathing.  She has occasional mild dry cough she thinks mostly related to GERD.  Past Medical History:  Diagnosis Date  . Acid reflux   . Chicken pox as a child  . Osteopenia     Past Surgical History:  Procedure Laterality Date  . ABDOMINAL HYSTERECTOMY  1992   ?oophotrectomy  . Foot surgery - Cotton, Gastroc Left 09/01/2012   1st ray elevatus, Equinus Lt    Social History   Socioeconomic History  . Marital status: Widowed    Spouse name: Not on file  . Number of children: 2  . Years of education: Not on file  . Highest education level: Not on file  Occupational History  . Occupation: Research scientist (life sciences): [employer]  . Financial  resource strain: Not on file  . Food insecurity:    Worry: Not on file    Inability: Not on file  . Transportation needs:    Medical: Not on file    Non-medical: Not on file  Tobacco Use  . Smoking status: Never Smoker  . Smokeless tobacco: Never Used  Substance and Sexual Activity  . Alcohol use: Yes    Alcohol/week: 0.0 standard drinks    Comment: rare  . Drug use: No  . Sexual activity: Not on file  Lifestyle  . Physical activity:    Days per week: Not on file    Minutes per session: Not on file  . Stress: Not on file  Relationships  . Social connections:    Talks on phone: Not on file    Gets together: Not on file    Attends religious service: Not on file    Active member of club or organization: Not on file    Attends meetings of clubs or organizations: Not on file    Relationship status: Not on file  . Intimate partner violence:    Fear of current or ex partner: Not on file    Emotionally abused: Not on file    Physically abused: Not on file    Forced sexual activity: Not on file  Other Topics Concern  . 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      Allergies as of 12/09/2018      Reactions   Pollen Extract Other (See Comments)   Nasal congestion.       Medication List       Accurate as of December 09, 2018  8:41 AM. Always use your most recent med list.        terbinafine 250 MG tablet Commonly known as:  LamISIL Take 1 tablet (250 mg total) by mouth daily.           Objective:   Physical Exam There were no vitals taken for this visit. This is a video conference, she is alert oriented x3 and in no distress    Assessment      Assessment  Hyperglycemia, A1c is 5.7 (2015) GERD-dyspepsia, chronic: saw GI , EGD (-) 2014 Neuropathy: 2016 labs (-), rx gabapentin (mild response but intolerant); saw neuro 2017;  NCS 10-2015  (-); ABIs wnl 12-2015; saw neuro surgery 12/28, 11/2017 MRI: mild DJD Osteopenia:   Tscore -1.8 (03-2014) , on ca and vit d  PLAN: GERD: Problem has been silent for a while, symptoms re-started 10 days ago.  No red flag symptoms. Prescribe Protonix 40 mg once daily for 4 to 6 weeks, then as needed.  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  Encouraged to continue with that.   I discussed the assessment and treatment plan with the patient. The patient was provided an opportunity to ask questions and all were answered. The patient agreed with the plan and demonstrated an understanding of the instructions.   The patient was advised to call back or seek an in-person evaluation if the symptoms worsen or if the condition fails to improve as anticipated.

## 2018-12-12 NOTE — Assessment & Plan Note (Signed)
GERD: Problem has been silent for a while, symptoms re-started 10 days ago.  No red flag symptoms. Prescribe Protonix 40 mg once daily for 4 to 6 weeks, then as needed.  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  Encouraged to continue with that.

## 2019-03-13 ENCOUNTER — Telehealth: Payer: Self-pay | Admitting: Internal Medicine

## 2019-03-13 MED ORDER — DEXLANSOPRAZOLE 60 MG PO CPDR: 60.0000 mg | DELAYED_RELEASE_CAPSULE | Freq: Every day | ORAL | 0 refills | Status: DC

## 2019-03-13 MED FILL — DEXILANT DR 60 MG CAPSULE: 60 | 30 days supply | Qty: 30 | Fill #0

## 2019-03-13 NOTE — Telephone Encounter (Signed)
Please advise 

## 2019-03-13 NOTE — Telephone Encounter (Signed)
Protonix is not working - requesting a change in medication. Please advise.

## 2019-03-13 NOTE — Telephone Encounter (Signed)
Discontinue Protonix Start Dexilant 60 mg, one po before breakfast, #30, no RF OV in 4 weeks to reassess.

## 2019-03-13 NOTE — Telephone Encounter (Signed)
LMOM informing Pt to return call. Okay for PEC to discuss. Dexilant sent to [place] [facility].

## 2019-03-16 ENCOUNTER — Encounter: Payer: Self-pay | Admitting: Internal Medicine

## 2019-03-16 ENCOUNTER — Other Ambulatory Visit: Payer: Self-pay

## 2019-03-16 ENCOUNTER — Ambulatory Visit: Payer: 59 | Admitting: Internal Medicine

## 2019-03-16 VITALS — BP 126/52 | HR 66 | Temp 97.7°F | Resp 16 | Ht 59.0 in | Wt 124.2 lb

## 2019-03-16 DIAGNOSIS — G609 Hereditary and idiopathic neuropathy, unspecified: Secondary | ICD-10-CM | POA: Diagnosis not present

## 2019-03-16 DIAGNOSIS — F419 Anxiety disorder, unspecified: Secondary | ICD-10-CM

## 2019-03-16 DIAGNOSIS — K219 Gastro-esophageal reflux disease without esophagitis: Secondary | ICD-10-CM

## 2019-03-16 DIAGNOSIS — K8000 Calculus of gallbladder with acute cholecystitis without obstruction: Secondary | ICD-10-CM

## 2019-03-16 DIAGNOSIS — R110 Nausea: Secondary | ICD-10-CM

## 2019-03-16 LAB — CBC WITH DIFFERENTIAL/PLATELET
Basophils Absolute: 0 10*3/uL (ref 0.0–0.1)
Basophils Relative: 0.5 % (ref 0.0–3.0)
Eosinophils Absolute: 0 10*3/uL (ref 0.0–0.7)
Eosinophils Relative: 0.8 % (ref 0.0–5.0)
HCT: 43.1 % (ref 36.0–46.0)
Hemoglobin: 14.1 g/dL (ref 12.0–15.0)
Lymphocytes Relative: 24.5 % (ref 12.0–46.0)
Lymphs Abs: 1.3 10*3/uL (ref 0.7–4.0)
MCHC: 32.7 g/dL (ref 30.0–36.0)
MCV: 91.7 fl (ref 78.0–100.0)
Monocytes Absolute: 0.3 10*3/uL (ref 0.1–1.0)
Monocytes Relative: 5.6 % (ref 3.0–12.0)
Neutro Abs: 3.7 10*3/uL (ref 1.4–7.7)
Neutrophils Relative %: 68.6 % (ref 43.0–77.0)
Platelets: 267 10*3/uL (ref 150.0–400.0)
RBC: 4.7 Mil/uL (ref 3.87–5.11)
RDW: 12.9 % (ref 11.5–15.5)
WBC: 5.4 10*3/uL (ref 4.0–10.5)

## 2019-03-16 LAB — COMPREHENSIVE METABOLIC PANEL
ALT: 12 U/L (ref 0–35)
AST: 15 U/L (ref 0–37)
Albumin: 4.3 g/dL (ref 3.5–5.2)
Alkaline Phosphatase: 53 U/L (ref 39–117)
BUN: 15 mg/dL (ref 6–23)
CO2: 26 mEq/L (ref 19–32)
Calcium: 9.3 mg/dL (ref 8.4–10.5)
Chloride: 108 mEq/L (ref 96–112)
Creatinine, Ser: 0.72 mg/dL (ref 0.40–1.20)
GFR: 82.21 mL/min (ref >60.00)
Glucose, Bld: 108 mg/dL — ABNORMAL HIGH (ref 70–99)
Potassium: 4.2 mEq/L (ref 3.5–5.1)
Sodium: 144 mEq/L (ref 135–145)
Total Bilirubin: 1.1 mg/dL (ref 0.2–1.2)
Total Protein: 6.9 g/dL (ref 6.0–8.3)

## 2019-03-16 MED ORDER — ESCITALOPRAM OXALATE 10 MG PO TABS: 10.0000 mg | ORAL_TABLET | Freq: Every day | ORAL | 1 refills | Status: DC

## 2019-03-16 MED FILL — ESCITALOPRAM 10 MG TABLET: 10 | 30 days supply | Qty: 30 | Fill #0

## 2019-03-16 NOTE — Progress Notes (Signed)
Subjective:    Patient ID: Marissa Grant, female    DOB: 12/26/1955, 63 y.o.   MRN: [id number]  DOS:  03/16/2019 Type of visit - description: Acute visit Was seen in April, with a 10-day history of epigastric burning and heartburn. Was recommended to go back on PPIs, she had to increase it to twice a day but she still has symptoms.  She also has dizziness for about 4 weeks, triggered by start walking or moving her head.  No associated nausea, some association with sweaty palms.  Neuropathy symptoms: Persisting and actually worse, "burning feeling" is now back.  I ask about anxiety and she admits that she is  stressed out due to coronavirus pandemia.  Occasionally feels depressed but typically she feels better after she talk with her daughter who is a Marine scientist.   Review of Systems Denies abdominal pain per se but she feels bloated. Bowel movements are regular, normal color, no blood in the stools. No dysphagia or odynophagia No chest pain, difficulty breathing, palpitations. Denies any stroke symptoms such as slurred speech, diplopia, facial numbness, motor weakness. No bladder or bowel incontinence, occasionally urinary frequency No syncope No major problems with insomnia  Past Medical History:  Diagnosis Date  . Acid reflux   . Chicken pox as a child  . Osteopenia     Past Surgical History:  Procedure Laterality Date  . ABDOMINAL HYSTERECTOMY  1992   ?oophotrectomy  . Foot surgery - Cotton, Gastroc Left 09/01/2012   1st ray elevatus, Equinus Lt    Social History   Socioeconomic History  . Marital status: Widowed    Spouse name: Not on file  . Number of children: 2  . Years of education: Not on file  . Highest education level: Not on file  Occupational History  . Occupation: Research scientist (life sciences): [employer]  . Financial resource strain: Not on file  . Food insecurity    Worry: Not on file    Inability: Not on file  . Transportation needs    Medical:  Not on file    Non-medical: Not on file  Tobacco Use  . Smoking status: Never Smoker  . Smokeless tobacco: Never Used  Substance and Sexual Activity  . Alcohol use: Yes    Alcohol/week: 0.0 standard drinks    Comment: rare  . Drug use: No  . Sexual activity: Not on file  Lifestyle  . Physical activity    Days per week: Not on file    Minutes per session: Not on file  . Stress: Not on file  Relationships  . Social Herbalist on phone: Not on file    Gets together: Not on file    Attends religious service: Not on file    Active member of club or organization: Not on file    Attends meetings of clubs or organizations: Not on file    Relationship status: Not on file  . Intimate partner violence    Fear of current or ex partner: Not on file    Emotionally abused: Not on file    Physically abused: Not on file    Forced sexual activity: Not on file  Other Topics Concern  . 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  Allergies as of 03/16/2019      Reactions   Pollen Extract Other (See Comments)   Nasal congestion.       Medication List       Accurate as of March 16, 2019 11:59 PM. If you have any questions, ask your nurse or doctor.        dexlansoprazole 60 MG capsule Commonly known as: DEXILANT Take 1 capsule (60 mg total) by mouth daily before breakfast.   escitalopram 10 MG tablet Commonly known as: Lexapro Take 1 tablet (10 mg total) by mouth daily. Started by: Kathlene November, MD           Objective:   Physical Exam BP (!) 126/52 (BP Location: Left Arm, Patient Position: Sitting, Cuff Size: Small)   Pulse 66   Temp 97.7 F (36.5 C) (Oral)   Resp 16   Ht 4\' 11"  (1.499 m)   Wt 124 lb 4 oz (56.4 kg)   SpO2 100%   BMI 25.10 kg/m  General:   Well developed, NAD, BMI noted.  HEENT:  Normocephalic . Face symmetric,  atraumatic Neck: Normal carotid pulses Lungs:  CTA B Normal respiratory effort, no intercostal retractions, no accessory muscle use. Heart: RRR,  no murmur.  no pretibial edema bilaterally  Abdomen:  Not distended, soft, non-tender. No rebound or rigidity.   Skin: Not pale. Not jaundice Neurologic:  alert & oriented X3.  Speech normal, gait appropriate for age and unassisted DTR symmetric Psych--  Cognition and judgment appear intact.  Cooperative with normal attention span and concentration.  Behavior appropriate. No anxious or depressed appearing.     Assessment    ASSESSMENT  Hyperglycemia, A1c is 5.7 (2015) GERD-dyspepsia, chronic: saw GI , EGD (-) 2014 Neuropathy: 2016 labs (-), rx gabapentin (mild response but intolerant); saw neuro 2017;  NCS 10-2015 (-); ABIs wnl 12-2015; saw neuro surgery 12/28, 11/2017 MRI: mild DJD Osteopenia:   Tscore -1.8 (03-2014) , on ca and vit d  PLAN: GERD: Since the last office visit she is taking pantoprazole and is here because it is not working.  We recently suggested to switch to [place] but she did not. She has heartburn and feels bloated. Plan: CMP, CBC; for completeness rx a abdominal US r/o GB issue. Switch to Dexilant Strict GERD precautions discussed. Dizziness: As described above, neurological exam is normal, related to anxiety?  Recommend observation for now reassess on RTC Anxiety, some depression.  This has become an issue for her, related to the covid pandemia.  I encouraged her to take advantage of counseling that is available at her workplace.  Also, we talk about SSRIs versus prn medication.  At the end we agreed to start Lexapro 10 mg nightly, watch for side effects, okay to cut it in half if she feels the dose is too much. Neuropathy: Increased sxs, will see Dr. Saintclair Halsted, neurosurgery soon. RTC 4 weeks for reassessment

## 2019-03-16 NOTE — Progress Notes (Signed)
Pre visit review using our clinic review tool, if applicable. No additional management support is needed unless otherwise documented below in the visit note. 

## 2019-03-16 NOTE — Patient Instructions (Addendum)
Get the blood work     [place]  Schedule your next appointment  For a check up in 4 weeks    Change from pantoprazole to Dexilant 60 mg 1 tablet before breakfast. Prescription sent to pharmacy   Start escitalopram 10 mg, 1 tablet at bedtime.  This is for anxiety management.  We will schedule a ultrasound  Call if you have severe symptoms

## 2019-03-17 DIAGNOSIS — F419 Anxiety disorder, unspecified: Secondary | ICD-10-CM | POA: Insufficient documentation

## 2019-03-17 NOTE — Assessment & Plan Note (Signed)
GERD: Since the last office visit she is taking pantoprazole and is here because it is not working.  We recently suggested to switch to [place] but she did not. She has heartburn and feels bloated. Plan: CMP, CBC; for completeness rx a abdominal US r/o GB issue. Switch to Dexilant Strict GERD precautions discussed. Dizziness: As described above, neurological exam is normal, related to anxiety?  Recommend observation for now reassess on RTC Anxiety, some depression.  This has become an issue for her, related to the covid pandemia.  I encouraged her to take advantage of counseling that is available at her workplace.  Also, we talk about SSRIs versus prn medication.  At the end we agreed to start Lexapro 10 mg nightly, watch for side effects, okay to cut it in half if she feels the dose is too much. Neuropathy: Increased sxs, will see Dr. Saintclair Halsted, neurosurgery soon. RTC 4 weeks for reassessment

## 2019-03-23 ENCOUNTER — Ambulatory Visit (HOSPITAL_BASED_OUTPATIENT_CLINIC_OR_DEPARTMENT_OTHER): Payer: 59

## 2019-03-24 ENCOUNTER — Encounter (HOSPITAL_BASED_OUTPATIENT_CLINIC_OR_DEPARTMENT_OTHER): Payer: Self-pay | Admitting: Radiology

## 2019-03-24 ENCOUNTER — Other Ambulatory Visit: Payer: Self-pay

## 2019-03-24 ENCOUNTER — Ambulatory Visit (HOSPITAL_BASED_OUTPATIENT_CLINIC_OR_DEPARTMENT_OTHER)
Admission: RE | Admit: 2019-03-24 | Discharge: 2019-03-24 | Disposition: A | Discharge status: Home / Self Care | Payer: 59 | Source: Ambulatory Visit | Attending: Internal Medicine | Admitting: Internal Medicine

## 2019-03-24 DIAGNOSIS — R110 Nausea: Secondary | ICD-10-CM | POA: Insufficient documentation

## 2019-03-24 DIAGNOSIS — K8020 Calculus of gallbladder without cholecystitis without obstruction: Secondary | ICD-10-CM | POA: Diagnosis not present

## 2019-03-27 NOTE — Addendum Note (Signed)
Addended byDamita Dunnings D on: 03/27/2019 08:03 AM   Modules accepted: Orders

## 2019-03-28 ENCOUNTER — Other Ambulatory Visit (HOSPITAL_BASED_OUTPATIENT_CLINIC_OR_DEPARTMENT_OTHER): Payer: 59

## 2019-03-28 DIAGNOSIS — G629 Polyneuropathy, unspecified: Secondary | ICD-10-CM | POA: Diagnosis not present

## 2019-04-21 ENCOUNTER — Ambulatory Visit: Payer: Self-pay | Admitting: General Surgery

## 2019-04-21 DIAGNOSIS — K8020 Calculus of gallbladder without cholecystitis without obstruction: Secondary | ICD-10-CM | POA: Diagnosis not present

## 2019-04-21 NOTE — H&P (Signed)
  istory of Present Illness Ralene Ok MD; 04/21/2019 11:27 AM) The patient is a 63 year old female who presents for evaluation of gall stones. Referred by: Dr. Larose Kells Chief Complaint: Abdominal pain  Patient is a 63 year old female with a history of reflux, bloating, epigastric abdominal pain. Patient states is been going on for several weeks. She states that she recently transitioned from multiple reflux medications to excellent. She states that she still continues with some minimal reflux. Patient states that she notices some epigastric bloating, abdominal pain with some nausea with citric foods. She states that she can try to stay away from high fatty foods.  Patient underwent an ultrasound. This ultrasound revealed a 2.1 cm solitary gallstone. Patient's LFTs were within normal limits.  Patient had previous BTL.    Past Surgical History Sabino Gasser, CMA; 04/21/2019 11:06 AM) Foot Surgery  Left.  Allergies Sabino Gasser, CMA; 04/21/2019 11:06 AM) No Known Drug Allergies  [04/21/2019]: Allergies Reconciled   Medication History Sabino Gasser, CMA; 04/21/2019 11:07 AM) Vitamin D (50 MCG(2000 UT) Capsule, Oral) Active. Vitamin C (Oral) Active. Medications Reconciled  Social History Sabino Gasser, CMA; 04/21/2019 11:06 AM) Alcohol use  Occasional alcohol use. Caffeine use  Coffee. No drug use  Tobacco use  Never smoker.  Family History Sabino Gasser, [place]; 04/21/2019 11:06 AM) First Degree Relatives  No pertinent family history   Pregnancy / Birth History Sabino Gasser, Brent; 04/21/2019 11:06 AM) Age at menarche  77 years. Regular periods     Review of Systems Ralene Ok MD; 04/21/2019 11:26 AM) General Present- Feeling well. Not Present- Fever. HEENT Present- Seasonal Allergies. Not Present- Earache, Hearing Loss, Hoarseness, Nose Bleed, Oral Ulcers, Ringing in the Ears, Sinus Pain, Sore Throat, Visual Disturbances, Wears glasses/contact lenses  and Yellow Eyes. Respiratory Not Present- Cough and Difficulty Breathing. Breast Present- Breast Mass. Not Present- Breast Pain, Nipple Discharge and Skin Changes. Cardiovascular Not Present- Chest Pain. Gastrointestinal Present- Abdominal Pain and Nausea. Musculoskeletal Not Present- Myalgia. Neurological Not Present- Weakness. All other systems negative  Vitals Sabino Gasser CMA; 04/21/2019 11:08 AM) 04/21/2019 11:07 AM Weight: 126.13 lb Height: 59in Body Surface Area: 1.52 m Body Mass Index: 25.47 kg/m  Temp.: 99.44F (Oral)  Pulse: 71 (Regular)  BP: 110/72(Sitting, Left Arm, Standard)       Assessment & Plan Ralene Ok MD; 04/21/2019 11:28 AM)  SYMPTOMATIC CHOLELITHIASIS (K80.20) Impression: 63 year old female with symptomatic cholelithiasis  1. We will proceed to the operating room for a laparoscopic cholecystectomy  2. Risks and benefits were discussed with the patient to generally include, but not limited to: infection, bleeding, possible need for post op ERCP, damage to the bile ducts, bile leak, and possible need for further surgery. Alternatives were offered and described. All questions were answered and the patient voiced understanding of the procedure and wishes to proceed at this point with a laparoscopic cholecystectomy

## 2019-05-04 DIAGNOSIS — G629 Polyneuropathy, unspecified: Secondary | ICD-10-CM | POA: Diagnosis not present

## 2019-05-18 NOTE — Patient Instructions (Addendum)
DUE TO COVID-19 ONLY ONE VISITOR IS ALLOWED TO COME WITH YOU AND STAY IN THE WAITING ROOM ONLY DURING PRE OP AND PROCEDURE DAY OF SURGERY. THE 1 VISITOR MAY VISIT WITH YOU AFTER SURGERY IN YOUR PRIVATE ROOM DURING VISITING HOURS ONLY!  YOU NEED TO HAVE A COVID 19 TEST ON___9-15-2020____ @___8 :45AM____, THIS TEST MUST BE DONE BEFORE SURGERY, COME  Marissa Grant , [id number].  ([place]) ONCE YOUR COVID TEST IS COMPLETED, PLEASE BEGIN THE QUARANTINE INSTRUCTIONS AS OUTLINED IN YOUR HANDOUT.                Marissa Grant   Your procedure is scheduled on: 05-26-2019   Report to [facility] Main  Entrance   Report to admitting at 9:00AM     Call this number if you have problems the morning of surgery [phone]    Remember: Do not eat food or drink liquids :After Midnight. BRUSH YOUR TEETH MORNING OF SURGERY AND RINSE YOUR MOUTH OUT, NO CHEWING GUM CANDY OR MINTS.     Take these medicines the morning of surgery with A SIP OF WATER: NONE                                 You may not have any metal on your body including hair pins and              piercings  Do not wear jewelry, make-up, lotions, powders or perfumes, deodorant             Do not wear nail polish.  Do not shave  48 hours prior to surgery.                 Do not bring valuables to the hospital. [place].  Contacts, dentures or bridgework may not be worn into surgery.       Patients discharged the day of surgery will not be allowed to drive home. IF YOU ARE HAVING SURGERY AND GOING HOME THE SAME DAY, YOU MUST HAVE AN ADULT TO DRIVE YOU HOME AND BE WITH YOU FOR 24 HOURS. YOU MAY GO HOME BY TAXI OR UBER OR ORTHERWISE, BUT AN ADULT MUST ACCOMPANY YOU HOME AND STAY WITH YOU FOR 24 HOURS.  Name and phone number of your driver:  Special Instructions: N/A              Please read over the following fact sheets you were  given: _____________________________________________________________________             [facility] - Preparing for Surgery Before surgery, you can play an important role.  Because skin is not sterile, your skin needs to be as free of germs as possible.  You can reduce the number of germs on your skin by washing with CHG (chlorahexidine gluconate) soap before surgery.  CHG is an antiseptic cleaner which kills germs and bonds with the skin to continue killing germs even after washing. Please DO NOT use if you have an allergy to CHG or antibacterial soaps.  If your skin becomes reddened/irritated stop using the CHG and inform your nurse when you arrive at Short Stay. Do not shave (including legs and underarms) for at least 48 hours prior to the first CHG shower.  You may shave your face/neck. Please follow  these instructions carefully:  1.  Shower with CHG Soap the night before surgery and the  morning of Surgery.  2.  If you choose to wash your hair, wash your hair first as usual with your  normal  shampoo.  3.  After you shampoo, rinse your hair and body thoroughly to remove the  shampoo.                           4.  Use CHG as you would any other liquid soap.  You can apply chg directly  to the skin and wash                       Gently with a scrungie or clean washcloth.  5.  Apply the CHG Soap to your body ONLY FROM THE NECK DOWN.   Do not use on face/ open                           Wound or open sores. Avoid contact with eyes, ears mouth and genitals (private parts).                       Wash face,  Genitals (private parts) with your normal soap.             6.  Wash thoroughly, paying special attention to the area where your surgery  will be performed.  7.  Thoroughly rinse your body with warm water from the neck down.  8.  DO NOT shower/wash with your normal soap after using and rinsing off  the CHG Soap.                9.  Pat yourself dry with a clean towel.            10.  Wear  clean pajamas.            11.  Place clean sheets on your bed the night of your first shower and do not  sleep with pets. Day of Surgery : Do not apply any lotions/deodorants the morning of surgery.  Please wear clean clothes to the hospital/surgery center.  FAILURE TO FOLLOW THESE INSTRUCTIONS MAY RESULT IN THE CANCELLATION OF YOUR SURGERY PATIENT SIGNATURE_________________________________  NURSE SIGNATURE__________________________________  ________________________________________________________________________

## 2019-05-19 ENCOUNTER — Other Ambulatory Visit: Payer: Self-pay

## 2019-05-19 ENCOUNTER — Encounter (HOSPITAL_COMMUNITY): Payer: Self-pay

## 2019-05-19 ENCOUNTER — Encounter (HOSPITAL_COMMUNITY)
Admission: RE | Admit: 2019-05-19 | Discharge: 2019-05-19 | Disposition: A | Discharge status: Home / Self Care | Payer: 59 | Source: Ambulatory Visit | Attending: General Surgery | Admitting: General Surgery

## 2019-05-19 DIAGNOSIS — K8080 Other cholelithiasis without obstruction: Secondary | ICD-10-CM | POA: Insufficient documentation

## 2019-05-19 DIAGNOSIS — Z01812 Encounter for preprocedural laboratory examination: Secondary | ICD-10-CM | POA: Insufficient documentation

## 2019-05-19 LAB — CBC
HCT: 44.1 % (ref 36.0–46.0)
Hemoglobin: 14.2 g/dL (ref 12.0–15.0)
MCH: 29.9 pg (ref 26.0–34.0)
MCHC: 32.2 g/dL (ref 30.0–36.0)
MCV: 92.8 fL (ref 80.0–100.0)
Platelets: 269 10*3/uL (ref 150–400)
RBC: 4.75 MIL/uL (ref 3.87–5.11)
RDW: 12.6 % (ref 11.5–15.5)
WBC: 5.9 10*3/uL (ref 4.0–10.5)
nRBC: 0 % (ref 0.0–0.2)

## 2019-05-19 NOTE — Progress Notes (Signed)
PCP - Colon Branch, MD Cardiologist -   Chest x-ray -  EKG -  Stress Test -  ECHO -  Cardiac Cath -   Sleep Study -  CPAP -   Fasting Blood Sugar -  Checks Blood Sugar _____ times a day  Blood Thinner Instructions: Aspirin Instructions: Last Dose:  Anesthesia review:   N/a  Patient denies shortness of breath, fever, cough and chest pain at PAT appointment   Patient verbalized understanding of instructions that were given to them at the PAT appointment. Patient was also instructed that they will need to review over the PAT instructions again at home before surgery.

## 2019-05-23 ENCOUNTER — Other Ambulatory Visit (HOSPITAL_COMMUNITY)
Admission: RE | Admit: 2019-05-23 | Discharge: 2019-05-23 | Disposition: A | Discharge status: Home / Self Care | Payer: 59 | Source: Ambulatory Visit | Attending: General Surgery | Admitting: General Surgery

## 2019-05-23 DIAGNOSIS — Z01812 Encounter for preprocedural laboratory examination: Secondary | ICD-10-CM | POA: Diagnosis not present

## 2019-05-23 DIAGNOSIS — Z20828 Contact with and (suspected) exposure to other viral communicable diseases: Secondary | ICD-10-CM | POA: Insufficient documentation

## 2019-05-24 LAB — NOVEL CORONAVIRUS, NAA (HOSP ORDER, SEND-OUT TO REF LAB; TAT 18-24 HRS): SARS-CoV-2, NAA: NOT DETECTED

## 2019-05-25 NOTE — H&P (Signed)
  History of Present Illness The patient is a 63 year old female who presents for evaluation of gall stones. Referred by: Dr. Larose Kells Chief Complaint: Abdominal pain  Patient is a 63 year old female with a history of reflux, bloating, epigastric abdominal pain. Patient states is been going on for several weeks. She states that she recently transitioned from multiple reflux medications to excellent. She states that she still continues with some minimal reflux. Patient states that she notices some epigastric bloating, abdominal pain with some nausea with citric foods. She states that she can try to stay away from high fatty foods.  Patient underwent an ultrasound. This ultrasound revealed a 2.1 cm solitary gallstone. Patient's LFTs were within normal limits.  Patient had previous BTL.    Past Surgical History Foot Surgery  Left.  Allergies No Known Drug Allergies  [04/21/2019]: Allergies Reconciled   Medication History  Vitamin D (50 MCG(2000 UT) Capsule, Oral) Active. Vitamin C (Oral) Active. Medications Reconciled  Social History Alcohol use  Occasional alcohol use. Caffeine use  Coffee. No drug use  Tobacco use  Never smoker.  Family History First Degree Relatives  No pertinent family history   Pregnancy / Birth History  Age at menarche  61 years. Regular periods     Review of Systems  General Present- Feeling well. Not Present- Fever. HEENT Present- Seasonal Allergies. Not Present- Earache, Hearing Loss, Hoarseness, Nose Bleed, Oral Ulcers, Ringing in the Ears, Sinus Pain, Sore Throat, Visual Disturbances, Wears glasses/contact lenses and Yellow Eyes. Respiratory Not Present- Cough and Difficulty Breathing. Breast Present- Breast Mass. Not Present- Breast Pain, Nipple Discharge and Skin Changes. Cardiovascular Not Present- Chest Pain. Gastrointestinal Present- Abdominal Pain and Nausea. Musculoskeletal Not Present-  Myalgia. Neurological Not Present- Weakness. All other systems negative   Assessment & Plan  SYMPTOMATIC CHOLELITHIASIS (K80.20) Impression: 63 year old female with symptomatic cholelithiasis  1. We will proceed to the operating room for a laparoscopic cholecystectomy  2. Risks and benefits were discussed with the patient to generally include, but not limited to: infection, bleeding, possible need for post op ERCP, damage to the bile ducts, bile leak, and possible need for further surgery. Alternatives were offered and described. All questions were answered and the patient voiced understanding of the procedure and wishes to proceed at this point with a laparoscopic cholecystectomy

## 2019-05-26 ENCOUNTER — Encounter (HOSPITAL_COMMUNITY): Payer: Self-pay | Admitting: *Deleted

## 2019-05-26 ENCOUNTER — Ambulatory Visit (HOSPITAL_COMMUNITY): Payer: 59 | Admitting: Anesthesiology

## 2019-05-26 ENCOUNTER — Other Ambulatory Visit: Payer: Self-pay

## 2019-05-26 ENCOUNTER — Ambulatory Visit (HOSPITAL_COMMUNITY)
Admission: RE | Admit: 2019-05-26 | Discharge: 2019-05-26 | Disposition: A | Discharge status: Home / Self Care | Payer: 59 | Attending: General Surgery | Admitting: General Surgery

## 2019-05-26 ENCOUNTER — Encounter (HOSPITAL_COMMUNITY)
Admission: RE | Disposition: A | Discharge status: Home / Self Care | Payer: Self-pay | Source: Home / Self Care | Attending: General Surgery

## 2019-05-26 DIAGNOSIS — M8580 Other specified disorders of bone density and structure, unspecified site: Secondary | ICD-10-CM | POA: Diagnosis not present

## 2019-05-26 DIAGNOSIS — K5900 Constipation, unspecified: Secondary | ICD-10-CM | POA: Diagnosis not present

## 2019-05-26 DIAGNOSIS — K8020 Calculus of gallbladder without cholecystitis without obstruction: Secondary | ICD-10-CM | POA: Diagnosis not present

## 2019-05-26 DIAGNOSIS — K219 Gastro-esophageal reflux disease without esophagitis: Secondary | ICD-10-CM | POA: Insufficient documentation

## 2019-05-26 DIAGNOSIS — G609 Hereditary and idiopathic neuropathy, unspecified: Secondary | ICD-10-CM | POA: Diagnosis not present

## 2019-05-26 DIAGNOSIS — K8010 Calculus of gallbladder with chronic cholecystitis without obstruction: Secondary | ICD-10-CM | POA: Insufficient documentation

## 2019-05-26 DIAGNOSIS — R599 Enlarged lymph nodes, unspecified: Secondary | ICD-10-CM | POA: Diagnosis not present

## 2019-05-26 HISTORY — PX: CHOLECYSTECTOMY: SHX55

## 2019-05-26 SURGERY — LAPAROSCOPIC CHOLECYSTECTOMY
Anesthesia: General | Site: Abdomen

## 2019-05-26 MED ORDER — ONDANSETRON HCL 4 MG/2ML IJ SOLN
INTRAMUSCULAR | Status: AC
  Filled 2019-05-26: qty 2

## 2019-05-26 MED ORDER — DEXAMETHASONE SODIUM PHOSPHATE 10 MG/ML IJ SOLN
INTRAMUSCULAR | Status: AC
  Filled 2019-05-26: qty 1

## 2019-05-26 MED ORDER — BUPIVACAINE HCL (PF) 0.25 % IJ SOLN
INTRAMUSCULAR | Status: DC | PRN
  Administered 2019-05-26: 15 mL

## 2019-05-26 MED ORDER — OXYCODONE HCL 5 MG PO TABS
ORAL_TABLET | ORAL | Status: AC
  Filled 2019-05-26: qty 1

## 2019-05-26 MED ORDER — CHLORHEXIDINE GLUCONATE CLOTH 2 % EX PADS: 6.0000 | MEDICATED_PAD | Freq: Once | CUTANEOUS | Status: DC

## 2019-05-26 MED ORDER — LIDOCAINE 2% (20 MG/ML) 5 ML SYRINGE
INTRAMUSCULAR | Status: DC | PRN
  Administered 2019-05-26: 60 mg via INTRAVENOUS

## 2019-05-26 MED ORDER — DEXAMETHASONE SODIUM PHOSPHATE 4 MG/ML IJ SOLN
INTRAMUSCULAR | Status: DC | PRN
  Administered 2019-05-26: 10 mg via INTRAVENOUS

## 2019-05-26 MED ORDER — SUGAMMADEX SODIUM 200 MG/2ML IV SOLN
INTRAVENOUS | Status: DC | PRN
  Administered 2019-05-26: 250 mg via INTRAVENOUS

## 2019-05-26 MED ORDER — ROCURONIUM BROMIDE 50 MG/5ML IV SOSY
PREFILLED_SYRINGE | INTRAVENOUS | Status: DC | PRN
  Administered 2019-05-26: 50 mg via INTRAVENOUS

## 2019-05-26 MED ORDER — BUPIVACAINE HCL (PF) 0.25 % IJ SOLN
INTRAMUSCULAR | Status: AC
  Filled 2019-05-26: qty 30

## 2019-05-26 MED ORDER — MIDAZOLAM HCL 5 MG/5ML IJ SOLN
INTRAMUSCULAR | Status: DC | PRN
  Administered 2019-05-26 (×2): 1 mg via INTRAVENOUS

## 2019-05-26 MED ORDER — TRAMADOL HCL 50 MG PO TABS: 50.0000 mg | ORAL_TABLET | Freq: Four times a day (QID) | ORAL | 0 refills | Status: DC | PRN

## 2019-05-26 MED ORDER — EPHEDRINE 5 MG/ML INJ
INTRAVENOUS | Status: AC
  Filled 2019-05-26: qty 10

## 2019-05-26 MED ORDER — OXYCODONE HCL 5 MG PO TABS
5.0000 mg | ORAL_TABLET | Freq: Once | ORAL | Status: AC | PRN
  Administered 2019-05-26: 5 mg via ORAL

## 2019-05-26 MED ORDER — LACTATED RINGERS IR SOLN
Status: DC | PRN
  Administered 2019-05-26: 1000 mL

## 2019-05-26 MED ORDER — ONDANSETRON HCL 4 MG/2ML IJ SOLN: 4.0000 mg | Freq: Once | INTRAMUSCULAR | Status: DC | PRN

## 2019-05-26 MED ORDER — MIDAZOLAM HCL 2 MG/2ML IJ SOLN
INTRAMUSCULAR | Status: AC
  Filled 2019-05-26: qty 2

## 2019-05-26 MED ORDER — FENTANYL CITRATE (PF) 100 MCG/2ML IJ SOLN
INTRAMUSCULAR | Status: DC | PRN
  Administered 2019-05-26 (×4): 50 ug via INTRAVENOUS

## 2019-05-26 MED ORDER — FENTANYL CITRATE (PF) 250 MCG/5ML IJ SOLN
INTRAMUSCULAR | Status: AC
  Filled 2019-05-26: qty 5

## 2019-05-26 MED ORDER — PROPOFOL 10 MG/ML IV BOLUS
INTRAVENOUS | Status: DC | PRN
  Administered 2019-05-26: 100 mg via INTRAVENOUS

## 2019-05-26 MED ORDER — EPHEDRINE SULFATE 50 MG/ML IJ SOLN
INTRAMUSCULAR | Status: DC | PRN
  Administered 2019-05-26: 5 mg via INTRAVENOUS

## 2019-05-26 MED ORDER — ONDANSETRON HCL 4 MG/2ML IJ SOLN
INTRAMUSCULAR | Status: DC | PRN
  Administered 2019-05-26: 4 mg via INTRAVENOUS

## 2019-05-26 MED ORDER — LACTATED RINGERS IV SOLN
INTRAVENOUS | Status: DC
  Administered 2019-05-26 (×2): via INTRAVENOUS

## 2019-05-26 MED ORDER — ROCURONIUM BROMIDE 10 MG/ML (PF) SYRINGE
PREFILLED_SYRINGE | INTRAVENOUS | Status: AC
  Filled 2019-05-26: qty 10

## 2019-05-26 MED ORDER — ACETAMINOPHEN 500 MG PO TABS
1000.0000 mg | ORAL_TABLET | ORAL | Status: AC
  Administered 2019-05-26: 1000 mg via ORAL
  Filled 2019-05-26: qty 2

## 2019-05-26 MED ORDER — CELECOXIB 200 MG PO CAPS
200.0000 mg | ORAL_CAPSULE | ORAL | Status: AC
  Administered 2019-05-26: 200 mg via ORAL
  Filled 2019-05-26: qty 1

## 2019-05-26 MED ORDER — FENTANYL CITRATE (PF) 100 MCG/2ML IJ SOLN: 25.0000 ug | INTRAMUSCULAR | Status: DC | PRN

## 2019-05-26 MED ORDER — GABAPENTIN 300 MG PO CAPS
300.0000 mg | ORAL_CAPSULE | ORAL | Status: AC
  Administered 2019-05-26: 300 mg via ORAL
  Filled 2019-05-26: qty 1

## 2019-05-26 MED ORDER — PROPOFOL 10 MG/ML IV BOLUS
INTRAVENOUS | Status: AC
  Filled 2019-05-26: qty 20

## 2019-05-26 MED ORDER — 0.9 % SODIUM CHLORIDE (POUR BTL) OPTIME
TOPICAL | Status: DC | PRN
  Administered 2019-05-26: 1000 mL

## 2019-05-26 MED ORDER — OXYCODONE HCL 5 MG/5ML PO SOLN: 5.0000 mg | Freq: Once | ORAL | Status: AC | PRN

## 2019-05-26 MED ORDER — CEFAZOLIN SODIUM-DEXTROSE 2-4 GM/100ML-% IV SOLN
2.0000 g | INTRAVENOUS | Status: AC
  Administered 2019-05-26: 11:00:00 2 g via INTRAVENOUS
  Filled 2019-05-26: qty 100

## 2019-05-26 MED ORDER — LIDOCAINE 2% (20 MG/ML) 5 ML SYRINGE
INTRAMUSCULAR | Status: AC
  Filled 2019-05-26: qty 5

## 2019-05-26 MED FILL — traMADol HCL 50 MG TABS: 50 | 5 days supply | Qty: 20 | Fill #0

## 2019-05-26 SURGICAL SUPPLY — 38 items
ADH SKN CLS APL DERMABOND .7 (GAUZE/BANDAGES/DRESSINGS) ×1
APL PRP STRL LF DISP 70% ISPRP (MISCELLANEOUS) ×1
APPLIER CLIP 5 13 M/L LIGAMAX5 (MISCELLANEOUS)
APR CLP MED LRG 5 ANG JAW (MISCELLANEOUS)
BAG SPEC RTRVL 10 TROC 200 (ENDOMECHANICALS)
CABLE HIGH FREQUENCY MONO STRZ (ELECTRODE) ×2 IMPLANT
CHLORAPREP W/TINT 26 (MISCELLANEOUS) ×2 IMPLANT
CLIP APPLIE 5 13 M/L LIGAMAX5 (MISCELLANEOUS) IMPLANT
CLIP VESOLOCK MED LG 6/CT (CLIP) ×3 IMPLANT
COVER MAYO STAND STRL (DRAPES) ×2 IMPLANT
COVER TRANSDUCER ULTRASND (DRAPES) IMPLANT
COVER WAND RF STERILE (DRAPES) IMPLANT
DECANTER SPIKE VIAL GLASS SM (MISCELLANEOUS) ×2 IMPLANT
DERMABOND ADVANCED (GAUZE/BANDAGES/DRESSINGS) ×1
DERMABOND ADVANCED .7 DNX12 (GAUZE/BANDAGES/DRESSINGS) ×1 IMPLANT
DRAPE C-ARM 42X120 X-RAY (DRAPES) ×2 IMPLANT
ELECT REM PT RETURN 15FT ADLT (MISCELLANEOUS) ×2 IMPLANT
GAUZE SPONGE 2X2 8PLY STRL LF (GAUZE/BANDAGES/DRESSINGS) ×1 IMPLANT
GLOVE BIO SURGEON STRL SZ7.5 (GLOVE) ×2 IMPLANT
GOWN STRL REUS W/TWL XL LVL3 (GOWN DISPOSABLE) ×6 IMPLANT
GRASPER SUT TROCAR 14GX15 (MISCELLANEOUS) IMPLANT
KIT BASIN OR (CUSTOM PROCEDURE TRAY) ×2 IMPLANT
KIT TURNOVER KIT A (KITS) IMPLANT
NDL INSUFFLATION 14GA 120MM (NEEDLE) ×1 IMPLANT
NEEDLE INSUFFLATION 14GA 120MM (NEEDLE) ×2 IMPLANT
POUCH RETRIEVAL ECOSAC 10 (ENDOMECHANICALS) IMPLANT
POUCH RETRIEVAL ECOSAC 10MM (ENDOMECHANICALS)
SCISSORS LAP 5X35 DISP (ENDOMECHANICALS) ×2 IMPLANT
SET CHOLANGIOGRAPH MIX (MISCELLANEOUS) ×2 IMPLANT
SET IRRIG TUBING LAPAROSCOPIC (IRRIGATION / IRRIGATOR) ×2 IMPLANT
SET TUBE SMOKE EVAC HIGH FLOW (TUBING) ×2 IMPLANT
SLEEVE XCEL OPT CAN 5 100 (ENDOMECHANICALS) ×2 IMPLANT
SPONGE GAUZE 2X2 STER 10/PKG (GAUZE/BANDAGES/DRESSINGS) ×1
SUT MNCRL AB 4-0 PS2 18 (SUTURE) ×2 IMPLANT
TOWEL OR 17X26 10 PK STRL BLUE (TOWEL DISPOSABLE) ×2 IMPLANT
TRAY LAPAROSCOPIC (CUSTOM PROCEDURE TRAY) ×2 IMPLANT
TROCAR BLADELESS OPT 5 100 (ENDOMECHANICALS) ×2 IMPLANT
TROCAR XCEL NON-BLD 11X100MML (ENDOMECHANICALS) ×2 IMPLANT

## 2019-05-26 NOTE — Anesthesia Postprocedure Evaluation (Signed)
Anesthesia Post Note  Patient: Marissa Grant  Procedure(s) Performed: LAPAROSCOPIC CHOLECYSTECTOMY (N/A Abdomen)     Patient location during evaluation: PACU Anesthesia Type: General Level of consciousness: awake and alert Pain management: pain level controlled Vital Signs Assessment: post-procedure vital signs reviewed and stable Respiratory status: spontaneous breathing, nonlabored ventilation, respiratory function stable and patient connected to nasal cannula oxygen Cardiovascular status: blood pressure returned to baseline and stable Postop Assessment: no apparent nausea or vomiting Anesthetic complications: no    Last Vitals:  Vitals:   05/26/19 1245 05/26/19 1259  BP: 122/65 124/63  Pulse: (!) 55 (!) 54  Resp: 16 15  Temp: (!) 36.3 C (!) 36.3 C  SpO2: 94% 94%    Last Pain:  Vitals:   05/26/19 1315  TempSrc:   PainSc: 3                  Dereonna Lensing COKER

## 2019-05-26 NOTE — Anesthesia Preprocedure Evaluation (Signed)
Anesthesia Evaluation  Patient identified by MRN, date of birth, ID band Patient awake    Reviewed: Allergy & Precautions, NPO status , Patient's Chart, lab work & pertinent test results  Airway Mallampati: II  TM Distance: >3 FB Neck ROM: Full    Dental  (+) Teeth Intact, Dental Advisory Given   Pulmonary    breath sounds clear to auscultation       Cardiovascular  Rhythm:Regular Rate:Normal     Neuro/Psych    GI/Hepatic   Endo/Other    Renal/GU      Musculoskeletal   Abdominal   Peds  Hematology   Anesthesia Other Findings   Reproductive/Obstetrics                             Anesthesia Physical Anesthesia Plan  ASA: I  Anesthesia Plan: General   Post-op Pain Management:    Induction: Intravenous  PONV Risk Score and Plan: Ondansetron and Dexamethasone  Airway Management Planned: Oral ETT  Additional Equipment:   Intra-op Plan:   Post-operative Plan: Extubation in OR  Informed Consent: I have reviewed the patients History and Physical, chart, labs and discussed the procedure including the risks, benefits and alternatives for the proposed anesthesia with the patient or authorized representative who has indicated his/her understanding and acceptance.     Dental advisory given  Plan Discussed with: CRNA and Anesthesiologist  Anesthesia Plan Comments:         Anesthesia Quick Evaluation  

## 2019-05-26 NOTE — Anesthesia Procedure Notes (Signed)
Procedure Name: Intubation Date/Time: 05/26/2019 10:50 AM Performed by: Deliah Boston, CRNA Pre-anesthesia Checklist: Patient identified, Emergency Drugs available, Suction available and Patient being monitored Patient Re-evaluated:Patient Re-evaluated prior to induction Oxygen Delivery Method: Circle system utilized Preoxygenation: Pre-oxygenation with 100% oxygen Induction Type: IV induction Ventilation: Mask ventilation without difficulty Laryngoscope Size: Mac and 3 Grade View: Grade I Tube type: Oral Tube size: 7.0 mm Number of attempts: 1 Airway Equipment and Method: Stylet and Oral airway Placement Confirmation: ETT inserted through vocal cords under direct vision,  positive ETCO2 and breath sounds checked- equal and bilateral Secured at: 21 cm Tube secured with: Tape Dental Injury: Teeth and Oropharynx as per pre-operative assessment

## 2019-05-26 NOTE — Transfer of Care (Signed)
Immediate Anesthesia Transfer of Care Note  Patient: Marissa Grant  Procedure(s) Performed: Procedure(s): LAPAROSCOPIC CHOLECYSTECTOMY (N/A)  Patient Location: PACU  Anesthesia Type:General  Level of Consciousness: Patient easily awoken, sedated, comfortable, cooperative, following commands, responds to stimulation.   Airway & Oxygen Therapy: Patient spontaneously breathing, ventilating well, oxygen via simple oxygen mask.  Post-op Assessment: Report given to PACU RN, vital signs reviewed and stable, moving all extremities.   Post vital signs: Reviewed and stable.  Complications: No apparent anesthesia complications  Last Vitals:  Vitals Value Taken Time  BP 117/62 05/26/19 1155  Temp    Pulse 59 05/26/19 1158  Resp 13 05/26/19 1158  SpO2 100 % 05/26/19 1158  Vitals shown include unvalidated device data.  Last Pain:  Vitals:   05/26/19 0909  TempSrc: Oral      Patients Stated Pain Goal: 4 ([id number])  Complications: No apparent anesthesia complications

## 2019-05-26 NOTE — Interval H&P Note (Signed)
History and Physical Interval Note:  05/26/2019 9:53 AM  Marissa Grant  has presented today for surgery, with the diagnosis of GALLSTONES.  The various methods of treatment have been discussed with the patient and family. After consideration of risks, benefits and other options for treatment, the patient has consented to  Procedure(s): LAPAROSCOPIC CHOLECYSTECTOMY (N/A) as a surgical intervention.  The patient's history has been reviewed, patient examined, no change in status, stable for surgery.  I have reviewed the patient's chart and labs.  Questions were answered to the patient's satisfaction.     Ralene Ok

## 2019-05-26 NOTE — Discharge Instructions (Signed)
[facility] ______[facility] °LAPAROSCOPIC SURGERY: POST OP INSTRUCTIONS °Always review your discharge instruction sheet given to you by the facility where your surgery was performed. °IF YOU HAVE DISABILITY OR FAMILY LEAVE FORMS, YOU MUST BRING THEM TO THE OFFICE FOR PROCESSING.   °DO NOT GIVE THEM TO YOUR DOCTOR. ° °1. A prescription for pain medication may be given to you upon discharge.  Take your pain medication as prescribed, if needed.  If narcotic pain medicine is not needed, then you may take acetaminophen (Tylenol) or ibuprofen (Advil) as needed. °2. Take your usually prescribed medications unless otherwise directed. °3. If you need a refill on your pain medication, please contact your pharmacy.  They will contact our office to request authorization. Prescriptions will not be filled after 5pm or on week-ends. °4. You should follow a light diet the first few days after arrival home, such as soup and crackers, etc.  Be sure to include lots of fluids daily. °5. Most patients will experience some swelling and bruising in the area of the incisions.  Ice packs will help.  Swelling and bruising can take several days to resolve.  °6. It is common to experience some constipation if taking pain medication after surgery.  Increasing fluid intake and taking a stool softener (such as Colace) will usually help or prevent this problem from occurring.  A mild laxative (Milk of Magnesia or Miralax) should be taken according to package instructions if there are no bowel movements after 48 hours. °7. Unless discharge instructions indicate otherwise, you may remove your bandages 24-48 hours after surgery, and you may shower at that time.  You may have steri-strips (small skin tapes) in place directly over the incision.  These strips should be left on the skin for 7-10 days.  If your surgeon used skin glue on the incision, you may shower in 24 hours.  The glue will flake off over the next 2-3 weeks.  Any sutures or  staples will be removed at the office during your follow-up visit. °8. ACTIVITIES:  You may resume regular (light) daily activities beginning the next day--such as daily self-care, walking, climbing stairs--gradually increasing activities as tolerated.  You may have sexual intercourse when it is comfortable.  Refrain from any heavy lifting or straining until approved by your doctor. °a. You may drive when you are no longer taking prescription pain medication, you can comfortably wear a seatbelt, and you can safely maneuver your car and apply brakes. °b. RETURN TO WORK:  __________________________________________________________ °9. You should see your doctor in the office for a follow-up appointment approximately 2-3 weeks after your surgery.  Make sure that you call for this appointment within a day or two after you arrive home to insure a convenient appointment time. °10. OTHER INSTRUCTIONS: __________________________________________________________________________________________________________________________ __________________________________________________________________________________________________________________________ °WHEN TO CALL YOUR DOCTOR: °1. Fever over 101.0 °2. Inability to urinate °3. Continued bleeding from incision. °4. Increased pain, redness, or drainage from the incision. °5. Increasing abdominal pain ° °The clinic staff is available to answer your questions during regular business hours.  Please don’t hesitate to call and ask to speak to one of the nurses for clinical concerns.  If you have a medical emergency, go to the nearest emergency room or call 911.  A surgeon from [facility] is always on call at the hospital. °[address], [address], [place], [place]  [address] ? [address] [address], [place], [place]   [address] °[phone] ? [phone] ? FAX [phone] °Web site:   [email or web address] °

## 2019-05-26 NOTE — Op Note (Signed)
05/26/2019  11:33 AM  PATIENT:  Marissa Grant  63 y.o. female  PRE-OPERATIVE DIAGNOSIS:  GALLSTONES  POST-OPERATIVE DIAGNOSIS:  GALLSTONES  PROCEDURE:  Procedure(s): LAPAROSCOPIC CHOLECYSTECTOMY (N/A)  SURGEON:  Surgeon(s) and Role:    Ralene Ok, MD - Primary  ANESTHESIA:   local and general  EBL:  minimal   BLOOD ADMINISTERED:none  DRAINS: none   LOCAL MEDICATIONS USED:  BUPIVICAINE   SPECIMEN:  Source of Specimen:  gallbladder  DISPOSITION OF SPECIMEN:  PATHOLOGY  COUNTS:  YES  TOURNIQUET:  * No tourniquets in log *  DICTATION: .Dragon Dictation  EBL: [id number]   Complications: none   Counts: reported as correct x 2   Findings:Chronically inflamed gallbladder and gallstone  Indications for procedure: Pt is a 50 F with RUQ pain and seen to have gallstones.   Details of the procedure: The patient was taken to the operating and placed in the supine position with bilateral SCDs in place. A time out was called and all facts were verified. A pneumoperitoneum was obtained via A Veress needle technique to a pressure of 65mm of mercury. A 31mm trochar was then placed in the right upper quadrant under visualization, and there were no injuries to any abdominal organs. A 11 mm port was then placed in the umbilical region after infiltrating with local anesthesia under direct visualization. A second epigastric port was placed under direct visualization.   The gallbladder was identified and retracted, the peritoneum was then sharply dissected from the gallbladder and this dissection was carried down to Calot's triangle. The cystic duct was identified and dissected circumferentially and seen going into the gallbladder 360.  The cystic artery was dissected away from the surrounding tissues.   The critical angle was obtained.    2 clips were placed proximally one distally and the cystic duct transected. The cystic artery was identified and 2 clips placed proximally and one distally  and transected. We then proceeded to remove the gallbladder off the hepatic fossa with Bovie cautery. A retrieval bag was then placed in the abdomen and gallbladder placed in the bag. The hepatic fossa was then reexamined and hemostasis was achieved with Bovie cautery and was excellent at this portion of the case. The subhepatic fossa and perihepatic fossa was then irrigated until the effluent was clear. The specimen bag and specimen were removed from the abdominal cavity.  The 11 mm trocar fascia was reapproximated with the Endo Close #1 Vicryl x2. The pneumoperitoneum was evacuated and all trochars removed under direct visulalization. The skin was then closed with 4-0 Monocryl and the skin dressed with Dermabond. The patient was awaken from general anesthesia and taken to the recovery room in stable condition.    PLAN OF CARE: Discharge to home after PACU  PATIENT DISPOSITION:  PACU - hemodynamically stable.   Delay start of Pharmacological VTE agent (>24hrs) due to surgical blood loss or risk of bleeding: not applicable

## 2019-05-27 ENCOUNTER — Encounter (HOSPITAL_COMMUNITY): Payer: Self-pay | Admitting: General Surgery

## 2019-05-29 DIAGNOSIS — Z1231 Encounter for screening mammogram for malignant neoplasm of breast: Secondary | ICD-10-CM | POA: Diagnosis not present

## 2019-05-29 LAB — HM MAMMOGRAPHY

## 2019-05-29 LAB — SURGICAL PATHOLOGY

## 2019-05-31 ENCOUNTER — Encounter: Payer: Self-pay | Admitting: Internal Medicine

## 2019-06-01 DIAGNOSIS — R208 Other disturbances of skin sensation: Secondary | ICD-10-CM | POA: Diagnosis not present

## 2019-06-01 DIAGNOSIS — G629 Polyneuropathy, unspecified: Secondary | ICD-10-CM | POA: Diagnosis not present

## 2019-06-05 MED FILL — PREGABALIN 50 MG CAPS: 50 | 30 days supply | Qty: 60 | Fill #0

## 2019-08-01 DIAGNOSIS — R030 Elevated blood-pressure reading, without diagnosis of hypertension: Secondary | ICD-10-CM | POA: Diagnosis not present

## 2019-08-01 DIAGNOSIS — G629 Polyneuropathy, unspecified: Secondary | ICD-10-CM | POA: Diagnosis not present

## 2019-08-01 DIAGNOSIS — Z6826 Body mass index (BMI) 26.0-26.9, adult: Secondary | ICD-10-CM | POA: Diagnosis not present

## 2019-08-07 ENCOUNTER — Encounter: Payer: Self-pay | Admitting: Neurology

## 2019-08-22 ENCOUNTER — Telehealth: Payer: Self-pay | Admitting: Internal Medicine

## 2019-08-22 DIAGNOSIS — G609 Hereditary and idiopathic neuropathy, unspecified: Secondary | ICD-10-CM

## 2019-08-22 NOTE — Telephone Encounter (Signed)
Pt came in office wanting to know if she can get a referral to go to [facility], pt was informed that provider is going to need to see pt (needing an appt), but pt stated still wanted to send a message to provider to see if possible to referral her since she has consulted with provider about situation before. Please advise. Pt tel [phone].

## 2019-08-28 NOTE — Telephone Encounter (Signed)
She has neuropathy, okay to refer her

## 2019-08-28 NOTE — Telephone Encounter (Signed)
Please advise 

## 2019-08-28 NOTE — Telephone Encounter (Signed)
Referral placed.

## 2019-08-29 ENCOUNTER — Ambulatory Visit (INDEPENDENT_AMBULATORY_CARE_PROVIDER_SITE_OTHER): Payer: 59 | Admitting: Advanced Practice Midwife

## 2019-08-29 ENCOUNTER — Encounter: Payer: Self-pay | Admitting: Advanced Practice Midwife

## 2019-08-29 ENCOUNTER — Other Ambulatory Visit: Payer: Self-pay

## 2019-08-29 VITALS — BP 121/54 | HR 73 | Ht 59.0 in | Wt 128.0 lb

## 2019-08-29 DIAGNOSIS — R350 Frequency of micturition: Secondary | ICD-10-CM

## 2019-08-29 DIAGNOSIS — Z01419 Encounter for gynecological examination (general) (routine) without abnormal findings: Secondary | ICD-10-CM | POA: Diagnosis not present

## 2019-08-29 DIAGNOSIS — Z1151 Encounter for screening for human papillomavirus (HPV): Secondary | ICD-10-CM | POA: Diagnosis not present

## 2019-08-29 DIAGNOSIS — Z124 Encounter for screening for malignant neoplasm of cervix: Secondary | ICD-10-CM | POA: Diagnosis not present

## 2019-08-29 LAB — POCT URINALYSIS DIPSTICK
Glucose, UA: NEGATIVE
Leukocytes, UA: NEGATIVE
Nitrite, UA: NEGATIVE
Protein, UA: NEGATIVE
Spec Grav, UA: 1.015 (ref 1.010–1.025)
pH, UA: 6 (ref 5.0–8.0)

## 2019-08-29 NOTE — Patient Instructions (Signed)
Health Maintenance, Female Adopting a healthy lifestyle and getting preventive care are important in promoting health and wellness. Ask your health care provider about:  The right schedule for you to have regular tests and exams.  Things you can do on your own to prevent diseases and keep yourself healthy. What should I know about diet, weight, and exercise? Eat a healthy diet   Eat a diet that includes plenty of vegetables, fruits, low-fat dairy products, and lean protein.  Do not eat a lot of foods that are high in solid fats, added sugars, or sodium. Maintain a healthy weight Body mass index (BMI) is used to identify weight problems. It estimates body fat based on height and weight. Your health care provider can help determine your BMI and help you achieve or maintain a healthy weight. Get regular exercise Get regular exercise. This is one of the most important things you can do for your health. Most adults should:  Exercise for at least 150 minutes each week. The exercise should increase your heart rate and make you sweat (moderate-intensity exercise).  Do strengthening exercises at least twice a week. This is in addition to the moderate-intensity exercise.  Spend less time sitting. Even light physical activity can be beneficial. Watch cholesterol and blood lipids Have your blood tested for lipids and cholesterol at 61 years of age, then have this test every 5 years. Have your cholesterol levels checked more often if:  Your lipid or cholesterol levels are high.  You are older than 61 years of age.  You are at high risk for heart disease. What should I know about cancer screening? Depending on your health history and family history, you may need to have cancer screening at various ages. This may include screening for:  Breast cancer.  Cervical cancer.  Colorectal cancer.  Skin cancer.  Lung cancer. What should I know about heart disease, diabetes, and high blood  pressure? Blood pressure and heart disease  High blood pressure causes heart disease and increases the risk of stroke. This is more likely to develop in people who have high blood pressure readings, are of African descent, or are overweight.  Have your blood pressure checked: ? Every 3-5 years if you are 18-39 years of age. ? Every year if you are 40 years old or older. Diabetes Have regular diabetes screenings. This checks your fasting blood sugar level. Have the screening done:  Once every three years after age 40 if you are at a normal weight and have a low risk for diabetes.  More often and at a younger age if you are overweight or have a high risk for diabetes. What should I know about preventing infection? Hepatitis B If you have a higher risk for hepatitis B, you should be screened for this virus. Talk with your health care provider to find out if you are at risk for hepatitis B infection. Hepatitis C Testing is recommended for:  Everyone born from 1945 through 1965.  Anyone with known risk factors for hepatitis C. Sexually transmitted infections (STIs)  Get screened for STIs, including gonorrhea and chlamydia, if: ? You are sexually active and are younger than 61 years of age. ? You are older than 61 years of age and your health care provider tells you that you are at risk for this type of infection. ? Your sexual activity has changed since you were last screened, and you are at increased risk for chlamydia or gonorrhea. Ask your health care provider if   you are at risk.  Ask your health care provider about whether you are at high risk for HIV. Your health care provider may recommend a prescription medicine to help prevent HIV infection. If you choose to take medicine to prevent HIV, you should first get tested for HIV. You should then be tested every 3 months for as long as you are taking the medicine. Pregnancy  If you are about to stop having your period (premenopausal) and  you may become pregnant, seek counseling before you get pregnant.  Take 400 to 800 micrograms (mcg) of folic acid every day if you become pregnant.  Ask for birth control (contraception) if you want to prevent pregnancy. Osteoporosis and menopause Osteoporosis is a disease in which the bones lose minerals and strength with aging. This can result in bone fractures. If you are 65 years old or older, or if you are at risk for osteoporosis and fractures, ask your health care provider if you should:  Be screened for bone loss.  Take a calcium or vitamin D supplement to lower your risk of fractures.  Be given hormone replacement therapy (HRT) to treat symptoms of menopause. Follow these instructions at home: Lifestyle  Do not use any products that contain nicotine or tobacco, such as cigarettes, e-cigarettes, and chewing tobacco. If you need help quitting, ask your health care provider.  Do not use street drugs.  Do not share needles.  Ask your health care provider for help if you need support or information about quitting drugs. Alcohol use  Do not drink alcohol if: ? Your health care provider tells you not to drink. ? You are pregnant, may be pregnant, or are planning to become pregnant.  If you drink alcohol: ? Limit how much you use to 0-1 drink a day. ? Limit intake if you are breastfeeding.  Be aware of how much alcohol is in your drink. In the [place] one drink equals one 12 oz bottle of beer (355 mL), one 5 oz glass of wine (148 mL), or one 1 oz glass of hard liquor (44 mL). General instructions  Schedule regular health, dental, and eye exams.  Stay current with your vaccines.  Tell your health care provider if: ? You often feel depressed. ? You have ever been abused or do not feel safe at home. Summary  Adopting a healthy lifestyle and getting preventive care are important in promoting health and wellness.  Follow your health care provider's instructions about healthy  diet, exercising, and getting tested or screened for diseases.  Follow your health care provider's instructions on monitoring your cholesterol and blood pressure. This information is not intended to replace advice given to you by your health care provider. Make sure you discuss any questions you have with your health care provider. Document Released: 03/09/2011 Document Revised: 08/17/2018 Document Reviewed: 08/17/2018 Elsevier Patient Education  2020 Elsevier Inc.  

## 2019-08-29 NOTE — Progress Notes (Signed)
GYNECOLOGY ANNUAL PREVENTATIVE CARE ENCOUNTER NOTE  Subjective:   Marissa Grant is a 63 y.o. G81P2002 female here for a routine annual gynecologic exam.  Current complaints: burning pain in lower back and legs.  No GYN complaints.   Denies abnormal vaginal bleeding, discharge, pelvic pain, problems with intercourse or other gynecologic concerns.    Gynecologic History No LMP recorded. Patient is postmenopausal. Contraception: Post menopausal Last Pap: unsure. Results were: normal Last mammogram: 05/29/2019. Results were: normal  Obstetric History OB History  Gravida Para Term Preterm AB Living  2 2 2     2   SAB TAB Ectopic Multiple Live Births          2    # Outcome Date GA Lbr Len/2nd Weight Sex Delivery Anes PTL Lv  2 Term 76 [redacted]w[redacted]d   F Vag-Spont  N LIV  1 Term 1985 108w0d   F Vag-Spont None N LIV    Past Medical History:  Diagnosis Date  . Acid reflux   . Chicken pox as a child  . Gallstones   . Osteopenia     Past Surgical History:  Procedure Laterality Date  . ABDOMINAL HYSTERECTOMY  1992   ?oophotrectomy  . CHOLECYSTECTOMY N/A 05/26/2019   Procedure: LAPAROSCOPIC CHOLECYSTECTOMY;  Surgeon: Ralene Ok, MD;  Location: [facility];  Service: General;  Laterality: N/A;  . COLONOSCOPY    . Foot surgery - Cotton, Gastroc Left 09/01/2012   1st ray elevatus, Equinus Lt  . TUBAL LIGATION     Hysterectomy documted previously- Patient denies hysterectomy, claims she still has uterus and ovaries 03/24/2019    Current Outpatient Medications on File Prior to Visit  Medication Sig Dispense Refill  . Ascorbic Acid (VITAMIN C) 1000 MG tablet Take 1,000 mg by mouth daily.    . cholecalciferol (VITAMIN D3) 25 MCG (1000 UT) tablet Take 1,000 Units by mouth daily.    . traMADol (ULTRAM) 50 MG tablet Take 1 tablet (50 mg total) by mouth every 6 (six) hours as needed. (Patient not taking: Reported on 08/29/2019) 20 tablet 0   No current facility-administered medications on file prior to  visit.    Allergies  Allergen Reactions  . Pollen Extract Other (See Comments)    Nasal congestion.     Social History   Socioeconomic History  . Marital status: Widowed    Spouse name: Not on file  . Number of children: 2  . Years of education: Not on file  . Highest education level: Not on file  Occupational History  . Occupation: Research scientist (life sciences): [employer]  Tobacco Use  . Smoking status: Never Smoker  . Smokeless tobacco: Never Used  Substance and Sexual Activity  . Alcohol use: Yes    Alcohol/week: 0.0 standard drinks    Comment: rare  . Drug use: No  . Sexual activity: Not Currently    Birth control/protection: None  Other Topics Concern  . 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   Social Determinants of Health   Financial Resource Strain:   . Difficulty of Paying Living Expenses: Not on file  Food Insecurity:   . Worried About Charity fundraiser in the Last Year: Not on file  . Ran Out of Food in the Last Year: Not on file  Transportation Needs:   .  Lack of Transportation (Medical): Not on file  . Lack of Transportation (Non-Medical): Not on file  Physical Activity:   . Days of Exercise per Week: Not on file  . Minutes of Exercise per Session: Not on file  Stress:   . Feeling of Stress : Not on file  Social Connections:   . Frequency of Communication with Friends and Family: Not on file  . Frequency of Social Gatherings with Friends and Family: Not on file  . Attends Religious Services: Not on file  . Active Member of Clubs or Organizations: Not on file  . Attends Archivist Meetings: Not on file  . Marital Status: Not on file  Intimate Partner Violence:   . Fear of Current or Ex-Partner: Not on file  . Emotionally Abused: Not on file  . Physically Abused: Not on file  . Sexually Abused: Not on file     Family History  Problem Relation Age of Onset  . Alzheimer's disease Mother   . Healthy Daughter   . Colon cancer Neg Hx   . Breast cancer Neg Hx   . Diabetes Neg Hx   . CAD Neg Hx     The following portions of the patient's history were reviewed and updated as appropriate: allergies, current medications, past family history, past medical history, past social history, past surgical history and problem list.  Review of Systems Pertinent items noted in HPI and remainder of comprehensive ROS otherwise negative.   Objective:  BP (!) 121/54   Pulse 73   Ht 4\' 11"  (1.499 m)   Wt 128 lb (58.1 kg)   BMI 25.85 kg/m  CONSTITUTIONAL: Well-developed, well-nourished female in no acute distress.  HENT:  Normocephalic, atraumatic, External right and left ear normal.  EYES: Conjunctivae and EOM are normal. No scleral icterus.  NECK: Normal range of motion, supple, no masses.  Normal thyroid.  SKIN: Skin is warm and dry. No rash noted. Not diaphoretic. No erythema. No pallor. NEUROLOGIC: Alert and oriented to person, place, and time. Normal reflexes, muscle tone coordination. No cranial nerve deficit noted. PSYCHIATRIC: Normal mood and affect. Normal behavior. Normal judgment and thought content. CARDIOVASCULAR: Normal heart rate noted, regular rhythm RESPIRATORY: Clear to auscultation bilaterally. Effort and breath sounds normal, no problems with respiration noted. BREASTS: Symmetric in size. No masses, skin changes, nipple drainage, or lymphadenopathy. ABDOMEN: Soft, normal bowel sounds, no distention noted.  No tenderness, rebound or guarding.  PELVIC: Normal appearing external genitalia; normal appearing vaginal mucosa and cervix.  No abnormal discharge noted.  Pap smear obtained.  Normal uterine size, no other palpable masses, no uterine or adnexal tenderness. MUSCULOSKELETAL: Normal range of motion. No tenderness.  No cyanosis, clubbing, or edema.  2+ distal pulses.   Assessment:   Annual gynecologic examination with pap smear Normal breast exam History of UTIs with normal UA today   Plan:  Will follow up results of pap smear and manage accordingly. Mammogram reviewed Routine preventative health maintenance measures emphasized. Please refer to After Visit Summary for other counseling recommendations.

## 2019-09-05 LAB — CYTOLOGY - PAP
Comment: NEGATIVE
Diagnosis: NEGATIVE
High risk HPV: POSITIVE — AB

## 2019-09-11 ENCOUNTER — Ambulatory Visit: Payer: 59 | Admitting: Neurology

## 2019-09-20 ENCOUNTER — Other Ambulatory Visit: Payer: Self-pay

## 2019-09-21 ENCOUNTER — Encounter: Payer: 59 | Admitting: Internal Medicine

## 2019-09-25 ENCOUNTER — Ambulatory Visit (INDEPENDENT_AMBULATORY_CARE_PROVIDER_SITE_OTHER): Payer: 59 | Admitting: Internal Medicine

## 2019-09-25 ENCOUNTER — Other Ambulatory Visit: Payer: Self-pay

## 2019-09-25 ENCOUNTER — Encounter: Payer: Self-pay | Admitting: Internal Medicine

## 2019-09-25 VITALS — BP 127/55 | HR 65 | Temp 96.6°F | Resp 16 | Ht 59.0 in | Wt 130.2 lb

## 2019-09-25 DIAGNOSIS — Z0000 Encounter for general adult medical examination without abnormal findings: Secondary | ICD-10-CM | POA: Diagnosis not present

## 2019-09-25 DIAGNOSIS — Z1211 Encounter for screening for malignant neoplasm of colon: Secondary | ICD-10-CM

## 2019-09-25 DIAGNOSIS — R739 Hyperglycemia, unspecified: Secondary | ICD-10-CM

## 2019-09-25 LAB — BASIC METABOLIC PANEL
BUN: 14 mg/dL (ref 6–23)
CO2: 30 mEq/L (ref 19–32)
Calcium: 9.7 mg/dL (ref 8.4–10.5)
Chloride: 108 mEq/L (ref 96–112)
Creatinine, Ser: 0.71 mg/dL (ref 0.40–1.20)
GFR: 83.4 mL/min (ref >60.00)
Glucose, Bld: 105 mg/dL — ABNORMAL HIGH (ref 70–99)
Potassium: 4.8 mEq/L (ref 3.5–5.1)
Sodium: 143 mEq/L (ref 135–145)

## 2019-09-25 LAB — LIPID PANEL
Cholesterol: 172 mg/dL (ref 0–200)
HDL: 56.1 mg/dL (ref >39.00)
LDL Cholesterol: 93 mg/dL (ref 0–99)
NonHDL: 116.08
Total CHOL/HDL Ratio: 3
Triglycerides: 116 mg/dL (ref 0.0–149.0)
VLDL: 23.2 mg/dL (ref 0.0–40.0)

## 2019-09-25 LAB — HEMOGLOBIN A1C: Hgb A1c MFr Bld: 5.9 % (ref 4.6–6.5)

## 2019-09-25 NOTE — Assessment & Plan Note (Signed)
-  Td 2015.  Had a flu shot. shingrix discussed, will hold off for now. -CCS: Colonoscopy 2011 at [facility], told normal, repeat in 10 years, no documentation.   Hemoccult card (-) 09/2017 per pt request. Refer to GI, Dr Ardis Hughs -Female care:  Sees gynecology   -Diet and exercise discussed - Labs:  BMP, FLP, A1c

## 2019-09-25 NOTE — Progress Notes (Signed)
Subjective:    Patient ID: Marissa Grant, female    DOB: December 18, 1955, 64 y.o.   MRN: [id number]  DOS:  09/25/2019 Type of visit - description: CPX Since the last office visit he is doing well.  In addition of CPX we discussed other issues as well.  Review of Systems  GERD well controlled with no medication, occasionally has some nausea that decreased with OTC reflux medicine. No dysphagia or odynophagia Continue with neuropathy  Other than above, a 14 point review of systems is negative    Past Medical History:  Diagnosis Date  . Acid reflux   . Chicken pox as a child  . Osteopenia     Past Surgical History:  Procedure Laterality Date  . ABDOMINAL HYSTERECTOMY  1992   ?oophotrectomy  . CHOLECYSTECTOMY N/A 05/26/2019   Procedure: LAPAROSCOPIC CHOLECYSTECTOMY;  Surgeon: Ralene Ok, MD;  Location: [facility];  Service: General;  Laterality: N/A;  . COLONOSCOPY    . Foot surgery - Cotton, Gastroc Left 09/01/2012   1st ray elevatus, Equinus Lt  . TUBAL LIGATION     Hysterectomy documted previously- Patient denies hysterectomy, claims she still has uterus and ovaries 03/24/2019   Family History  Problem Relation Age of Onset  . Alzheimer's disease Mother   . Healthy Daughter   . Colon cancer Neg Hx   . Breast cancer Neg Hx   . Diabetes Neg Hx   . CAD Neg Hx       Objective:   Physical Exam BP (!) 127/55 (BP Location: Left Arm, Patient Position: Sitting, Cuff Size: Small)   Pulse 65   Temp (!) 96.6 F (35.9 C) (Axillary)   Resp 16   Ht 4\' 11"  (1.499 m)   Wt 130 lb 4 oz (59.1 kg)   SpO2 100%   BMI 26.31 kg/m  General: Well developed, NAD, BMI noted Neck: No  thyromegaly  HEENT:  Normocephalic . Face symmetric, atraumatic Lungs:  CTA B Normal respiratory effort, no intercostal retractions, no accessory muscle use. Heart: RRR,  no murmur.  No pretibial edema bilaterally  Abdomen:  Not distended, soft, non-tender. No rebound or rigidity.   Skin: Exposed areas  without rash. Not pale. Not jaundice Neurologic:  alert & oriented X3.  Speech normal, gait appropriate for age and unassisted Strength symmetric and appropriate for age.  Psych: Cognition and judgment appear intact.  Cooperative with normal attention span and concentration.  Behavior appropriate. No anxious or depressed appearing.     Assessment     ASSESSMENT  Hyperglycemia, A1c is 5.7 (2015) GERD-dyspepsia, chronic: saw GI , EGD (-) 2014 Neuropathy: 2016 labs (-), rx gabapentin (mild response but intolerant); saw neuro 2017;  NCS 10-2015 (-); ABIs wnl 12-2015; saw neuro surgery 12/28, 11/2017 MRI: mild DJD Osteopenia:   Tscore -1.8 (03-2014) , T score -1.9 (09-2018) on ca and vit d  PLAN: Here for CPX Hyperglycemia: Check A1c GE RD, dyspepsia: Currently well controlled with no medications, only occasionally has mild reflux and nausea that decreased with OTC antiacids. Neuropathy: Since the last visit, saw Dr. Saintclair Halsted, Dr. Brien Few, apparently no further testing was done, symptoms are the same, to see pain management soon. Anxiety, some depression: Currently not an issue Osteopenia: Last DEXA 09-2018, T score -1.9, on calcium and vitamin D.  She is very active at work RTC 1 year  This visit occurred during the SARS-CoV-2 public health emergency.  Safety protocols were in place, including screening questions prior to the visit, additional  usage of staff PPE, and extensive cleaning of exam room while observing appropriate contact time as indicated for disinfecting solutions.

## 2019-09-25 NOTE — Assessment & Plan Note (Signed)
Here for CPX Hyperglycemia: Check A1c GE RD, dyspepsia: Currently well controlled with no medications, only occasionally has mild reflux and nausea that decreased with OTC antiacids. Neuropathy: Since the last visit, saw Dr. Saintclair Halsted, Dr. Brien Few, apparently no further testing was done, symptoms are the same, to see pain management soon. Anxiety, some depression: Currently not an issue Osteopenia: Last DEXA 09-2018, T score -1.9, on calcium and vitamin D.  She is very active at work RTC 1 year

## 2019-09-25 NOTE — Patient Instructions (Signed)
GO TO THE LAB : Get the blood work     GO TO THE FRONT DESK Schedule your next appointment   for a physical exam in 1 year  You do qualify to get Shingrix vaccination, please call in few weeks if you are interested

## 2019-10-18 DIAGNOSIS — Z79891 Long term (current) use of opiate analgesic: Secondary | ICD-10-CM | POA: Diagnosis not present

## 2019-10-18 DIAGNOSIS — M47816 Spondylosis without myelopathy or radiculopathy, lumbar region: Secondary | ICD-10-CM | POA: Diagnosis not present

## 2019-10-18 DIAGNOSIS — G603 Idiopathic progressive neuropathy: Secondary | ICD-10-CM | POA: Diagnosis not present

## 2019-10-18 DIAGNOSIS — G894 Chronic pain syndrome: Secondary | ICD-10-CM | POA: Diagnosis not present

## 2019-10-18 DIAGNOSIS — M150 Primary generalized (osteo)arthritis: Secondary | ICD-10-CM | POA: Diagnosis not present

## 2019-10-18 MED FILL — DOXEPIN 10 MG CAPSULE: 10 | 30 days supply | Qty: 30 | Fill #0

## 2019-11-01 DIAGNOSIS — H524 Presbyopia: Secondary | ICD-10-CM | POA: Diagnosis not present

## 2019-11-01 DIAGNOSIS — H11002 Unspecified pterygium of left eye: Secondary | ICD-10-CM | POA: Diagnosis not present

## 2019-11-01 DIAGNOSIS — Z135 Encounter for screening for eye and ear disorders: Secondary | ICD-10-CM | POA: Diagnosis not present

## 2019-11-15 ENCOUNTER — Telehealth: Payer: Self-pay | Admitting: Gastroenterology

## 2019-11-15 NOTE — Telephone Encounter (Signed)
Dr. Ardis Hughs, this pt is referred for a colonoscopy.  She last saw you in 2014.  She had her previous 2011 colonoscopy faxed for review.  Please advise scheduling.

## 2019-11-21 MED FILL — DOXEPIN 10 MG CAPSULE: 10 | 30 days supply | Qty: 30 | Fill #1

## 2019-12-14 NOTE — Telephone Encounter (Signed)
Please check status of records.

## 2019-12-15 ENCOUNTER — Encounter: Payer: Self-pay | Admitting: Gastroenterology

## 2019-12-15 NOTE — Telephone Encounter (Signed)
I do not recall seeing this before however I just reviewed colonoscopy April 2011 Dr. Harrell Lark at [facility].  Indication routine risk screening.  Findings normal colonoscopy.   I do think she is due for colon cancer screening with a colonoscopy at this point, okay to arrange as a "direct" [place] colonoscopy.  Thanks

## 2019-12-20 ENCOUNTER — Telehealth: Payer: Self-pay | Admitting: *Deleted

## 2019-12-20 DIAGNOSIS — G894 Chronic pain syndrome: Secondary | ICD-10-CM | POA: Diagnosis not present

## 2019-12-20 DIAGNOSIS — G603 Idiopathic progressive neuropathy: Secondary | ICD-10-CM | POA: Diagnosis not present

## 2019-12-20 DIAGNOSIS — M47816 Spondylosis without myelopathy or radiculopathy, lumbar region: Secondary | ICD-10-CM | POA: Diagnosis not present

## 2019-12-20 DIAGNOSIS — M150 Primary generalized (osteo)arthritis: Secondary | ICD-10-CM | POA: Diagnosis not present

## 2019-12-20 MED FILL — DOXEPIN 10 MG CAPSULE: 10 | 30 days supply | Qty: 60 | Fill #0

## 2019-12-20 NOTE — Telephone Encounter (Signed)
PT was a NOS to her PV.  LMOM at 3:45 pm to call back before 5:00 pm or procedure would have to be cancelled

## 2019-12-20 NOTE — Telephone Encounter (Signed)
Pt did not call back to reschedule PV.  Procedure for 01-01-20 cancelled and NOS letter sent to pt

## 2019-12-22 ENCOUNTER — Other Ambulatory Visit: Payer: Self-pay

## 2019-12-22 ENCOUNTER — Ambulatory Visit (AMBULATORY_SURGERY_CENTER): Payer: 59 | Admitting: *Deleted

## 2019-12-22 VITALS — Temp 96.8°F | Ht 59.0 in | Wt 134.0 lb

## 2019-12-22 DIAGNOSIS — Z1211 Encounter for screening for malignant neoplasm of colon: Secondary | ICD-10-CM

## 2019-12-22 MED ORDER — NA SULFATE-K SULFATE-MG SULF 17.5-3.13-1.6 GM/177ML PO SOLN: ORAL | 0 refills | Status: DC

## 2019-12-22 MED FILL — SUPREP BOWEL PREP KIT: 17.5-3.13-1 | 1 days supply | Qty: 354 | Fill #0

## 2019-12-22 NOTE — Progress Notes (Signed)
Patient is here in-person for PV. Patient denies any allergies to eggs or soy. Patient denies any problems with anesthesia/sedation. Patient denies any oxygen use at home. Patient denies taking any diet/weight loss medications or blood thinners. Patient is not being treated for MRSA or C-diff.  Patient is aware of our care-partner policy and 0000000 safety protocol.   Pt completed both vaccines, 2nd dose on 09/25/2019 per pt.

## 2020-01-01 ENCOUNTER — Other Ambulatory Visit: Payer: Self-pay

## 2020-01-01 ENCOUNTER — Encounter: Payer: 59 | Admitting: Gastroenterology

## 2020-01-01 ENCOUNTER — Encounter: Payer: Self-pay | Admitting: Gastroenterology

## 2020-01-01 ENCOUNTER — Ambulatory Visit (AMBULATORY_SURGERY_CENTER): Payer: 59 | Admitting: Gastroenterology

## 2020-01-01 VITALS — BP 127/57 | HR 56 | Temp 97.3°F | Resp 15 | Ht 59.0 in | Wt 134.0 lb

## 2020-01-01 DIAGNOSIS — Z1211 Encounter for screening for malignant neoplasm of colon: Secondary | ICD-10-CM | POA: Diagnosis not present

## 2020-01-01 DIAGNOSIS — D122 Benign neoplasm of ascending colon: Secondary | ICD-10-CM

## 2020-01-01 MED ORDER — SODIUM CHLORIDE 0.9 % IV SOLN: 500.0000 mL | Freq: Once | INTRAVENOUS | Status: DC

## 2020-01-01 NOTE — Patient Instructions (Signed)
Discharge instructions given. Handout on polyp. Resume previous medications. YOU HAD AN ENDOSCOPIC PROCEDURE TODAY AT [facility]:   Refer to the procedure report that was given to you for any specific questions about what was found during the examination.  If the procedure report does not answer your questions, please call your gastroenterologist to clarify.  If you requested that your care partner not be given the details of your procedure findings, then the procedure report has been included in a sealed envelope for you to review at your convenience later.  YOU SHOULD EXPECT: Some feelings of bloating in the abdomen. Passage of more gas than usual.  Walking can help get rid of the air that was put into your GI tract during the procedure and reduce the bloating. If you had a lower endoscopy (such as a colonoscopy or flexible sigmoidoscopy) you may notice spotting of blood in your stool or on the toilet paper. If you underwent a bowel prep for your procedure, you may not have a normal bowel movement for a few days.  Please Note:  You might notice some irritation and congestion in your nose or some drainage.  This is from the oxygen used during your procedure.  There is no need for concern and it should clear up in a day or so.  SYMPTOMS TO REPORT IMMEDIATELY:   Following lower endoscopy (colonoscopy or flexible sigmoidoscopy):  Excessive amounts of blood in the stool  Significant tenderness or worsening of abdominal pains  Swelling of the abdomen that is new, acute  Fever of 100F or higher   For urgent or emergent issues, a gastroenterologist can be reached at any hour by calling [phone]. Do not use MyChart messaging for urgent concerns.    DIET:  We do recommend a small meal at first, but then you may proceed to your regular diet.  Drink plenty of fluids but you should avoid alcoholic beverages for 24 hours.  ACTIVITY:  You should plan to take it easy for the rest  of today and you should NOT DRIVE or use heavy machinery until tomorrow (because of the sedation medicines used during the test).    FOLLOW UP: Our staff will call the number listed on your records 48-72 hours following your procedure to check on you and address any questions or concerns that you may have regarding the information given to you following your procedure. If we do not reach you, we will leave a message.  We will attempt to reach you two times.  During this call, we will ask if you have developed any symptoms of COVID 19. If you develop any symptoms (ie: fever, flu-like symptoms, shortness of breath, cough etc.) before then, please call [phone].  If you test positive for Covid 19 in the 2 weeks post procedure, please call and report this information to [place].    If any biopsies were taken you will be contacted by phone or by letter within the next 1-3 weeks.  Please call us at [phone] if you have not heard about the biopsies in 3 weeks.    SIGNATURES/CONFIDENTIALITY: You and/or your care partner have signed paperwork which will be entered into your electronic medical record.  These signatures attest to the fact that that the information above on your After Visit Summary has been reviewed and is understood.  Full responsibility of the confidentiality of this discharge information lies with you and/or your care-partner.

## 2020-01-01 NOTE — Op Note (Signed)
[facility] Patient Name: Marissa Grant Procedure Date: 01/01/2020 11:49 AM MRN: [id number] Endoscopist: Milus Banister , MD Age: 64 Referring MD:  Date of Birth: [birth year] Gender: Female Account #: [id number] Procedure:                Colonoscopy Indications:              Screening for colorectal malignant neoplasm Medicines:                Monitored Anesthesia Care Procedure:                Pre-Anesthesia Assessment:                           - Prior to the procedure, a History and Physical                            was performed, and patient medications and                            allergies were reviewed. The patient's tolerance of                            previous anesthesia was also reviewed. The risks                            and benefits of the procedure and the sedation                            options and risks were discussed with the patient.                            All questions were answered, and informed consent                            was obtained. Prior Anticoagulants: The patient has                            taken no previous anticoagulant or antiplatelet                            agents. ASA Grade Assessment: II - A patient with                            mild systemic disease. After reviewing the risks                            and benefits, the patient was deemed in                            satisfactory condition to undergo the procedure.                           After obtaining informed consent, the colonoscope  was passed under direct vision. Throughout the                            procedure, the patient's blood pressure, pulse, and                            oxygen saturations were monitored continuously. The                            Colonoscope was introduced through the anus and                            advanced to the the cecum, identified by                            appendiceal orifice and  ileocecal valve. The                            colonoscopy was performed without difficulty. The                            patient tolerated the procedure well. The quality                            of the bowel preparation was good. The ileocecal                            valve, appendiceal orifice, and rectum were                            photographed. Scope In: 11:51:47 AM Scope Out: 12:02:05 PM Scope Withdrawal Time: 0 hours 7 minutes 28 seconds  Total Procedure Duration: 0 hours 10 minutes 18 seconds  Findings:                 A 2 mm polyp was found in the ascending colon. The                            polyp was sessile. The polyp was removed with a                            cold snare. Resection and retrieval were complete.                           The exam was otherwise without abnormality on                            direct and retroflexion views. Complications:            No immediate complications. Estimated blood loss:                            None. Estimated Blood Loss:     Estimated blood loss: none. Impression:               -  One 2 mm polyp in the ascending colon, removed                            with a cold snare. Resected and retrieved.                           - The examination was otherwise normal on direct                            and retroflexion views. Recommendation:           - Patient has a contact number available for                            emergencies. The signs and symptoms of potential                            delayed complications were discussed with the                            patient. Return to normal activities tomorrow.                            Written discharge instructions were provided to the                            patient.                           - Resume previous diet.                           - Continue present medications.                           - Await pathology results. Milus Banister, MD 01/01/2020  12:11:54 PM This report has been signed electronically.

## 2020-01-01 NOTE — Progress Notes (Signed)
Called to room to assist during endoscopic procedure.  Patient ID and intended procedure confirmed with present staff. Received instructions for my participation in the procedure from the performing physician.  

## 2020-01-01 NOTE — Progress Notes (Signed)
PT taken to PACU. Monitors in place. VSS. Report given to RN. 

## 2020-01-01 NOTE — Progress Notes (Signed)
Pt's states no medical or surgical changes since previsit or office visit. 

## 2020-01-03 ENCOUNTER — Telehealth: Payer: Self-pay

## 2020-01-03 NOTE — Telephone Encounter (Signed)
No answer, left message to call if having any issues or concerns, B.Gottlieb Zuercher RN 

## 2020-01-03 NOTE — Telephone Encounter (Signed)
Called [phone] and left a messaged we tried to reach pt for a follow up call. maw

## 2020-01-05 ENCOUNTER — Encounter: Payer: Self-pay | Admitting: Gastroenterology

## 2020-01-22 MED FILL — DOXEPIN 10 MG CAPSULE: 10 | 30 days supply | Qty: 60 | Fill #1

## 2020-01-31 DIAGNOSIS — G894 Chronic pain syndrome: Secondary | ICD-10-CM | POA: Diagnosis not present

## 2020-01-31 DIAGNOSIS — M150 Primary generalized (osteo)arthritis: Secondary | ICD-10-CM | POA: Diagnosis not present

## 2020-01-31 DIAGNOSIS — M47816 Spondylosis without myelopathy or radiculopathy, lumbar region: Secondary | ICD-10-CM | POA: Diagnosis not present

## 2020-01-31 DIAGNOSIS — G603 Idiopathic progressive neuropathy: Secondary | ICD-10-CM | POA: Diagnosis not present

## 2020-04-29 ENCOUNTER — Ambulatory Visit: Payer: 59 | Admitting: Internal Medicine

## 2020-04-29 ENCOUNTER — Encounter: Payer: Self-pay | Admitting: Internal Medicine

## 2020-04-29 ENCOUNTER — Other Ambulatory Visit: Payer: Self-pay

## 2020-04-29 VITALS — BP 148/78 | HR 66 | Temp 98.5°F | Resp 16 | Ht 59.0 in | Wt 124.5 lb

## 2020-04-29 DIAGNOSIS — G629 Polyneuropathy, unspecified: Secondary | ICD-10-CM | POA: Diagnosis not present

## 2020-04-29 MED ORDER — DULOXETINE HCL 30 MG PO CPEP: ORAL_CAPSULE | ORAL | 3 refills | Status: DC

## 2020-04-29 MED FILL — DULoxetine HCL 30 MG CPEP: 30 | 30 days supply | Qty: 60 | Fill #0

## 2020-04-29 NOTE — Assessment & Plan Note (Signed)
Neuropathy: Symptom onset in 2016, initially the burning reached  the mid pretibial area now is up to the waist. Extensive blood work before, s/p  neurology eval, NCS, ABIs, back MRI ;  all were  unrevealing. Lately has tried doxepin and alpha-lipoic acid (ALA) 600 mg with no help. Plan:  TSH, P25, folic acid, homocystine level, MMA testing. Trial with Cymbalta. Stop doxepin and alpha lipoic acid ; they are ineffective Discussed neurology referral, declines, likes to do all the above first before further referrals. RTC 3 months

## 2020-04-29 NOTE — Patient Instructions (Addendum)
You can stop alpha-lipoic acid  You can stop the doxepin  Start Cymbalta 30 mg: 1 tablet at bedtime the first 2 weeks, then 2 tablets at bedtime   GO TO THE LAB : Get the blood work     GO TO THE FRONT DESK, [place] Come back for a check up in 3 months

## 2020-04-29 NOTE — Progress Notes (Signed)
Pre visit review using our clinic review tool, if applicable. No additional management support is needed unless otherwise documented below in the visit note. 

## 2020-04-29 NOTE — Progress Notes (Signed)
Subjective:    Patient ID: Marissa Grant, female    DOB: 1956-05-07, 64 y.o.   MRN: [id number]  DOS:  04/29/2020 Type of visit - description: Acute Her chief complaint is neuropathy. Currently she complains of a burning feeling from the plantar area to the waist. Is day or night. She has try few medication since the last time I saw her with no help  Review of Systems Denies any motor deficits  Past Medical History:  Diagnosis Date  . Acid reflux   . Chicken pox as a child  . Osteopenia     Past Surgical History:  Procedure Laterality Date  . ABDOMINAL HYSTERECTOMY  1992   ?oophotrectomy  . CHOLECYSTECTOMY N/A 05/26/2019   Procedure: LAPAROSCOPIC CHOLECYSTECTOMY;  Surgeon: Ralene Ok, MD;  Location: [facility];  Service: General;  Laterality: N/A;  . COLONOSCOPY  2011   Dr.Peters   . Foot surgery - Cotton, Gastroc Left 09/01/2012   1st ray elevatus, Equinus Lt  . TUBAL LIGATION     Hysterectomy documted previously- Patient denies hysterectomy, claims she still has uterus and ovaries 03/24/2019    Allergies as of 04/29/2020      Reactions   Pollen Extract Other (See Comments)   Nasal congestion.       Medication List       Accurate as of April 29, 2020  8:45 PM. If you have any questions, ask your nurse or doctor.        STOP taking these medications   Alpha-Lipoic Acid 600 MG Caps Stopped by: Kathlene November, MD   doxepin 10 MG capsule Commonly known as: SINEQUAN Stopped by: Kathlene November, MD     TAKE these medications   cholecalciferol 25 MCG (1000 UNIT) tablet Commonly known as: VITAMIN D3 Take 1,000 Units by mouth daily.   DULoxetine 30 MG capsule Commonly known as: Cymbalta 1 tablet daily for 2 weeks, then 2 tablets daily Started by: Kathlene November, MD   Tylenol 325 MG Caps Generic drug: Acetaminophen SMARTSIG:1 Tablet(s) By Mouth Daily   vitamin C 1000 MG tablet Take 1,000 mg by mouth daily.          Objective:   Physical Exam BP (!) 148/78 (BP Location:  Left Arm, Patient Position: Sitting, Cuff Size: Small)   Pulse 66   Temp 98.5 F (36.9 C) (Oral)   Resp 16   Ht 4\' 11"  (1.499 m)   Wt 124 lb 8 oz (56.5 kg)   SpO2 97%   BMI 25.15 kg/m  General:   Well developed, NAD, BMI noted. HEENT:  Normocephalic . Face symmetric, atraumatic  Skin: Not pale. Not jaundice Neurologic:  alert & oriented X3.  Speech normal, gait appropriate for age and unassisted. Motor and DTR symmetric Psych--  Cognition and judgment appear intact.  Cooperative with normal attention span and concentration.  Behavior appropriate. No anxious or depressed appearing.      Assessment     ASSESSMENT  Hyperglycemia, A1c is 5.7 (2015) GERD-dyspepsia, chronic: saw GI , EGD (-) 2014 Neuropathy: 2016 labs (-), rx gabapentin (mild response but intolerant); saw neuro 2017;  NCS 10-2015 (-); ABIs wnl 12-2015; saw neuro surgery 12/28, 11/2017 MRI: mild DJD Osteopenia:   Tscore -1.8 (03-2014) , T score -1.9 (09-2018) on ca and vit d  PLAN: Neuropathy: Symptom onset in 2016, initially the burning reached  the mid pretibial area now is up to the waist. Extensive blood work before, s/p  neurology eval, NCS, ABIs, back MRI ;  all were  unrevealing. Lately has tried doxepin and alpha-lipoic acid (ALA) 600 mg with no help. Plan:  TSH, B84, folic acid, homocystine level, MMA testing. Trial with Cymbalta. Stop doxepin and alpha lipoic acid ; they are ineffective Discussed neurology referral, declines, likes to do all the above first before further referrals. RTC 3 months   This visit occurred during the SARS-CoV-2 public health emergency.  Safety protocols were in place, including screening questions prior to the visit, additional usage of staff PPE, and extensive cleaning of exam room while observing appropriate contact time as indicated for disinfecting solutions.

## 2020-05-01 LAB — HOMOCYSTEINE: Homocysteine: 7.6 umol/L (ref <10.4)

## 2020-05-01 LAB — B12 AND FOLATE PANEL
Folate: 14 ng/mL
Vitamin B-12: 418 pg/mL (ref 200–1100)

## 2020-05-01 LAB — TSH: TSH: 1.43 mIU/L (ref 0.40–4.50)

## 2020-05-04 LAB — METHYLMALONIC ACID(MMA), RND URINE
Creatinine: 6.84 mmol/L (ref 1.77–23.31)
Methylmalonic acid: 1.5 mmol/mol{creat} (ref 0.3–2.2)

## 2020-06-03 DIAGNOSIS — Z1231 Encounter for screening mammogram for malignant neoplasm of breast: Secondary | ICD-10-CM | POA: Diagnosis not present

## 2020-06-03 LAB — HM MAMMOGRAPHY

## 2020-06-04 ENCOUNTER — Encounter: Payer: Self-pay | Admitting: Internal Medicine

## 2020-09-25 ENCOUNTER — Encounter: Payer: 59 | Admitting: Internal Medicine

## 2020-10-02 ENCOUNTER — Other Ambulatory Visit: Payer: Self-pay

## 2020-10-02 ENCOUNTER — Encounter: Payer: Self-pay | Admitting: Internal Medicine

## 2020-10-02 ENCOUNTER — Ambulatory Visit (INDEPENDENT_AMBULATORY_CARE_PROVIDER_SITE_OTHER): Payer: 59 | Admitting: Internal Medicine

## 2020-10-02 VITALS — BP 126/80 | HR 60 | Temp 98.0°F | Resp 16 | Ht 59.0 in | Wt 127.2 lb

## 2020-10-02 DIAGNOSIS — R739 Hyperglycemia, unspecified: Secondary | ICD-10-CM

## 2020-10-02 DIAGNOSIS — Z0000 Encounter for general adult medical examination without abnormal findings: Secondary | ICD-10-CM | POA: Diagnosis not present

## 2020-10-02 DIAGNOSIS — G629 Polyneuropathy, unspecified: Secondary | ICD-10-CM | POA: Diagnosis not present

## 2020-10-02 LAB — COMPREHENSIVE METABOLIC PANEL
ALT: 16 U/L (ref 0–35)
AST: 17 U/L (ref 0–37)
Albumin: 4.4 g/dL (ref 3.5–5.2)
Alkaline Phosphatase: 57 U/L (ref 39–117)
BUN: 17 mg/dL (ref 6–23)
CO2: 29 mEq/L (ref 19–32)
Calcium: 9.5 mg/dL (ref 8.4–10.5)
Chloride: 107 mEq/L (ref 96–112)
Creatinine, Ser: 0.74 mg/dL (ref 0.40–1.20)
GFR: 86.32 mL/min (ref >60.00)
Glucose, Bld: 93 mg/dL (ref 70–99)
Potassium: 4.1 mEq/L (ref 3.5–5.1)
Sodium: 142 mEq/L (ref 135–145)
Total Bilirubin: 1.2 mg/dL (ref 0.2–1.2)
Total Protein: 7.1 g/dL (ref 6.0–8.3)

## 2020-10-02 LAB — CBC WITH DIFFERENTIAL/PLATELET
Basophils Absolute: 0 10*3/uL (ref 0.0–0.1)
Basophils Relative: 0.7 % (ref 0.0–3.0)
Eosinophils Absolute: 0 10*3/uL (ref 0.0–0.7)
Eosinophils Relative: 0.5 % (ref 0.0–5.0)
HCT: 42.7 % (ref 36.0–46.0)
Hemoglobin: 14.2 g/dL (ref 12.0–15.0)
Lymphocytes Relative: 27.8 % (ref 12.0–46.0)
Lymphs Abs: 1.5 10*3/uL (ref 0.7–4.0)
MCHC: 33.2 g/dL (ref 30.0–36.0)
MCV: 90.1 fl (ref 78.0–100.0)
Monocytes Absolute: 0.2 10*3/uL (ref 0.1–1.0)
Monocytes Relative: 4.3 % (ref 3.0–12.0)
Neutro Abs: 3.6 10*3/uL (ref 1.4–7.7)
Neutrophils Relative %: 66.7 % (ref 43.0–77.0)
Platelets: 240 10*3/uL (ref 150.0–400.0)
RBC: 4.74 Mil/uL (ref 3.87–5.11)
RDW: 12.8 % (ref 11.5–15.5)
WBC: 5.3 10*3/uL (ref 4.0–10.5)

## 2020-10-02 LAB — LIPID PANEL
Cholesterol: 160 mg/dL (ref 0–200)
HDL: 55.3 mg/dL (ref >39.00)
LDL Cholesterol: 87 mg/dL (ref 0–99)
NonHDL: 104.33
Total CHOL/HDL Ratio: 3
Triglycerides: 86 mg/dL (ref 0.0–149.0)
VLDL: 17.2 mg/dL (ref 0.0–40.0)

## 2020-10-02 LAB — HEMOGLOBIN A1C: Hgb A1c MFr Bld: 5.9 % (ref 4.6–6.5)

## 2020-10-02 NOTE — Progress Notes (Signed)
Subjective:    Patient ID: Marissa Grant, female    DOB: 1956/08/19, 65 y.o.   MRN: [id number]  DOS:  10/02/2020 Type of visit - description: CPX  Since the last office visit she is feeling about the same, her only concern continue to be neuropathy.  Review of Systems  Other than above, a 14 point review of systems is negative      Past Medical History:  Diagnosis Date  . Acid reflux   . Chicken pox as a child  . Osteopenia     Past Surgical History:  Procedure Laterality Date  . ABDOMINAL HYSTERECTOMY  1992   ?oophotrectomy  . CHOLECYSTECTOMY N/A 05/26/2019   Procedure: LAPAROSCOPIC CHOLECYSTECTOMY;  Surgeon: Ralene Ok, MD;  Location: [facility];  Service: General;  Laterality: N/A;  . COLONOSCOPY  2011   Dr.Peters   . Foot surgery - Cotton, Gastroc Left 09/01/2012   1st ray elevatus, Equinus Lt  . TUBAL LIGATION     Hysterectomy documted previously- Patient denies hysterectomy, claims she still has uterus and ovaries 03/24/2019    Allergies as of 10/02/2020      Reactions   Pollen Extract Other (See Comments)   Nasal congestion.       Medication List       Accurate as of October 02, 2020 11:59 PM. If you have any questions, ask your nurse or doctor.        STOP taking these medications   DULoxetine 30 MG capsule Commonly known as: Cymbalta Stopped by: Kathlene November, MD     TAKE these medications   cholecalciferol 25 MCG (1000 UNIT) tablet Commonly known as: VITAMIN D3 Take 1,000 Units by mouth daily.   Tylenol 325 MG Caps Generic drug: Acetaminophen SMARTSIG:1 Tablet(s) By Mouth Daily   vitamin C 1000 MG tablet Take 1,000 mg by mouth daily.          Objective:   Physical Exam BP 126/80 (BP Location: Left Arm, Patient Position: Sitting, Cuff Size: Small)   Pulse 60   Temp 98 F (36.7 C) (Oral)   Resp 16   Ht 4\' 11"  (1.499 m)   Wt 127 lb 4 oz (57.7 kg)   SpO2 97%   BMI 25.70 kg/m  General: Well developed, NAD, BMI noted Neck: No   thyromegaly  HEENT:  Normocephalic . Face symmetric, atraumatic Lungs:  CTA B Normal respiratory effort, no intercostal retractions, no accessory muscle use. Heart: RRR,  no murmur.  Abdomen:  Not distended, soft, non-tender. No rebound or rigidity.   Lower extremities: no pretibial edema bilaterally  Skin: Exposed areas without rash. Not pale. Not jaundice Neurologic:  alert & oriented X3.  Speech normal, gait appropriate for age and unassisted Strength symmetric and appropriate for age.  Psych: Cognition and judgment appear intact.  Cooperative with normal attention span and concentration.  Behavior appropriate. No anxious or depressed appearing.     Assessment      ASSESSMENT  Hyperglycemia, A1c is 5.7 (2015) GERD-dyspepsia, chronic: saw GI , EGD (-) 2014 Neuropathy: 2016 labs (-), rx gabapentin (mild response but intolerant); saw neuro 2017;  NCS 10-2015 (-); ABIs wnl 12-2015; saw neuro surgery 12/28, 11/2017 MRI: mild DJD Osteopenia:   Tscore -1.8 (03-2014) , T score -1.9 (09-2018) on ca and vit d  PLAN: Here for CPX Hyperglycemia: Check A1c GERD dyspepsia: Symptoms are stable, no major problems Neuropathy: See last visit, additional labs were okay, Cymbalta did not help.  Options discussed, elected a  second opinion, will refer to [facility]. Osteopenia: On calcium and vitamin D.  Next DEXA 2025. RTC 1 year-Td 2015  This visit occurred during the SARS-CoV-2 public health emergency.  Safety protocols were in place, including screening questions prior to the visit, additional usage of staff PPE, and extensive cleaning of exam room while observing appropriate contact time as indicated for disinfecting solutions.

## 2020-10-02 NOTE — Patient Instructions (Signed)
   GO TO THE LAB : Get the blood work     GO TO THE FRONT DESK, PLEASE SCHEDULE YOUR APPOINTMENTS Come back for a physical exam in 1 year 

## 2020-10-02 NOTE — Progress Notes (Signed)
Pre visit review using our clinic review tool, if applicable. No additional management support is needed unless otherwise documented below in the visit note. 

## 2020-10-03 ENCOUNTER — Encounter: Payer: Self-pay | Admitting: Internal Medicine

## 2020-10-03 NOTE — Assessment & Plan Note (Signed)
Here for CPX Hyperglycemia: Check A1c GERD dyspepsia: Symptoms are stable, no major problems Neuropathy: See last visit, additional labs were okay, Cymbalta did not help.  Options discussed, elected a second opinion, will refer to [facility]. Osteopenia: On calcium and vitamin D.  Next DEXA 2025. RTC 1 year-Td 2015

## 2020-10-03 NOTE — Assessment & Plan Note (Signed)
-  Td 2015 -Shingrix discussed before -Had 3 COVID vaccines - Had a flu shot.   -CCS: Colonoscopy 2011 at [facility], told normal, C-scope again 12-2019, next per GI, Dr Ardis Hughs -Female care: Sees gynecology   -Diet and exercise: Doing well. -Labs: CMP, FLP, CBC, A1c

## 2020-10-23 ENCOUNTER — Other Ambulatory Visit (HOSPITAL_COMMUNITY)
Admission: RE | Admit: 2020-10-23 | Discharge: 2020-10-23 | Disposition: A | Discharge status: Home / Self Care | Payer: 59 | Source: Ambulatory Visit | Attending: Obstetrics and Gynecology | Admitting: Obstetrics and Gynecology

## 2020-10-23 ENCOUNTER — Other Ambulatory Visit (HOSPITAL_COMMUNITY): Payer: Self-pay | Admitting: Obstetrics and Gynecology

## 2020-10-23 ENCOUNTER — Ambulatory Visit (INDEPENDENT_AMBULATORY_CARE_PROVIDER_SITE_OTHER): Payer: 59 | Admitting: Obstetrics and Gynecology

## 2020-10-23 ENCOUNTER — Encounter: Payer: Self-pay | Admitting: Obstetrics and Gynecology

## 2020-10-23 ENCOUNTER — Other Ambulatory Visit: Payer: Self-pay

## 2020-10-23 VITALS — BP 129/59 | HR 66 | Ht 59.0 in | Wt 128.0 lb

## 2020-10-23 DIAGNOSIS — Z124 Encounter for screening for malignant neoplasm of cervix: Secondary | ICD-10-CM | POA: Diagnosis not present

## 2020-10-23 DIAGNOSIS — Z113 Encounter for screening for infections with a predominantly sexual mode of transmission: Secondary | ICD-10-CM | POA: Diagnosis not present

## 2020-10-23 DIAGNOSIS — L900 Lichen sclerosus et atrophicus: Secondary | ICD-10-CM | POA: Diagnosis not present

## 2020-10-23 DIAGNOSIS — Z01419 Encounter for gynecological examination (general) (routine) without abnormal findings: Secondary | ICD-10-CM

## 2020-10-23 DIAGNOSIS — L292 Pruritus vulvae: Secondary | ICD-10-CM | POA: Diagnosis not present

## 2020-10-23 DIAGNOSIS — Z1231 Encounter for screening mammogram for malignant neoplasm of breast: Secondary | ICD-10-CM

## 2020-10-23 MED ORDER — CLOBETASOL PROPIONATE 0.05 % EX OINT: TOPICAL_OINTMENT | CUTANEOUS | 5 refills | Status: DC

## 2020-10-23 MED FILL — CLOBETASOL PROPIONATE 0.05: 0.05 | 30 days supply | Qty: 30 | Fill #0

## 2020-10-23 NOTE — Patient Instructions (Signed)
Vulvar Hygiene You should wear cotton brief underwear and avoid tight-fitting undergarments including avoiding underwear made of synthetic fabrics. You should keep area as dry as possible. Use non-irritating soaps and laundry detergents. Use as few products in vulvar area as possible as even products designed to improve irritation can cause more irritation. Do not use douches, lotions or vaginal sprays as these are very irritating.

## 2020-10-23 NOTE — Progress Notes (Signed)
GYNECOLOGY ANNUAL PREVENTATIVE CARE ENCOUNTER NOTE  Subjective:   Marissa Grant is a 65 y.o. G4P2002 female here for a annual gynecologic exam. Current complaints: vulvar itching x 1 month. Has not tried anything yet. No discharge.   Denies abnormal vaginal bleeding, discharge, pelvic pain, problems with intercourse or other gynecologic concerns. Accepts STI screen.   Gynecologic History No LMP recorded. Patient is postmenopausal. Contraception: post menopausal status Last Pap: 08/2019. Results: negative with high risk HPV positive Last mammogram: 05/2020. Results: normal DEXA: has had- osteopenia  Obstetric History OB History  Gravida Para Term Preterm AB Living  2 2 2     2   SAB IAB Ectopic Multiple Live Births          2    # Outcome Date GA Lbr Len/2nd Weight Sex Delivery Anes PTL Lv  2 Term 64 [redacted]w[redacted]d   F Vag-Spont  N LIV  1 Term 82 [redacted]w[redacted]d   F Vag-Spont None N LIV    Past Medical History:  Diagnosis Date  . Acid reflux   . Chicken pox as a child  . Osteopenia     Past Surgical History:  Procedure Laterality Date  . ABDOMINAL HYSTERECTOMY  1992   ?oophotrectomy  . CHOLECYSTECTOMY N/A 05/26/2019   Procedure: LAPAROSCOPIC CHOLECYSTECTOMY;  Surgeon: Ralene Ok, MD;  Location: [facility];  Service: General;  Laterality: N/A;  . COLONOSCOPY  2011   Dr.Peters   . Foot surgery - Cotton, Gastroc Left 09/01/2012   1st ray elevatus, Equinus Lt  . TUBAL LIGATION     Hysterectomy documted previously- Patient denies hysterectomy, claims she still has uterus and ovaries 03/24/2019    Current Outpatient Medications on File Prior to Visit  Medication Sig Dispense Refill  . Ascorbic Acid (VITAMIN C) 1000 MG tablet Take 1,000 mg by mouth daily.    . cholecalciferol (VITAMIN D3) 25 MCG (1000 UT) tablet Take 1,000 Units by mouth daily.    . TYLENOL 325 MG CAPS SMARTSIG:1 Tablet(s) By Mouth Daily (Patient not taking: No sig reported)     No current facility-administered  medications on file prior to visit.    Allergies  Allergen Reactions  . Pollen Extract Other (See Comments)    Nasal congestion.     Social History   Socioeconomic History  . Marital status: Widowed    Spouse name: Not on file  . Number of children: 2  . Years of education: Not on file  . Highest education level: Not on file  Occupational History  . Occupation: Research scientist (life sciences): [employer]  Tobacco Use  . Smoking status: Never Smoker  . Smokeless tobacco: Never Used  Vaping Use  . Vaping Use: Never used  Substance and Sexual Activity  . Alcohol use: Yes    Alcohol/week: 0.0 standard drinks    Comment: rare wine  . Drug use: No  . Sexual activity: Not Currently    Birth control/protection: None  Other Topics Concern  . 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   Social Determinants of Health   Financial Resource Strain: Not on file  Food Insecurity: Not on file  Transportation Needs: Not on file  Physical Activity: Not on file  Stress: Not on file  Social Connections: Not on file  Intimate Partner Violence: Not  on file    Family History  Problem Relation Age of Onset  . Alzheimer's disease Mother   . Healthy Daughter   . Colon cancer Neg Hx   . Breast cancer Neg Hx   . Diabetes Neg Hx   . CAD Neg Hx   . Colon polyps Neg Hx   . Esophageal cancer Neg Hx   . Rectal cancer Neg Hx   . Stomach cancer Neg Hx     The following portions of the patient's history were reviewed and updated as appropriate: allergies, current medications, past family history, past medical history, past social history, past surgical history and problem list.  Review of Systems Pertinent items are noted in HPI.   Objective:  BP (!) 129/59   Pulse 66   Ht 4\' 11"  (1.499 m)   Wt 128 lb 0.6 oz (58.1 kg)   BMI 25.86 kg/m  CONSTITUTIONAL:  Well-developed, well-nourished female in no acute distress.  HENT:  Normocephalic, atraumatic, External right and left ear normal. Oropharynx is clear and moist EYES: Conjunctivae and EOM are normal. Pupils are equal, round, and reactive to light. No scleral icterus.  NECK: Normal range of motion, supple, no masses.  Normal thyroid.  SKIN: Skin is warm and dry. No rash noted. Not diaphoretic. No erythema. No pallor. NEUROLOGIC: Alert and oriented to person, place, and time. Normal reflexes, muscle tone coordination. No cranial nerve deficit noted. PSYCHIATRIC: Normal mood and affect. Normal behavior. Normal judgment and thought content. CARDIOVASCULAR: Normal heart rate noted RESPIRATORY: Effort normal, no problems with respiration noted. BREASTS: Symmetric in size. No masses, skin changes, nipple drainage, or lymphadenopathy. ABDOMEN: Soft, no distention noted.  No tenderness, rebound or guarding.  PELVIC: Normal appearing external genitalia with pink, irritated appearing areas just lateral to labia minora, R>L, with some excoriations; normal appearing vaginal mucosa and cervix.  No abnormal discharge noted.  Pap smear obtained. Pelvic cultures obtained. Normal uterine size, no other palpable masses, no uterine or adnexal tenderness. MUSCULOSKELETAL: Normal range of motion. No tenderness.  No cyanosis, clubbing, or edema.  2+ distal pulses.  Exam done with chaperone present.  Assessment and Plan:   1. Well woman exam  2. Cervical cancer screening - Cytology - PAP( [facility])  3. Encounter for screening mammogram for malignant neoplasm of breast Normal 05/2020 Repeat 1 year  4. Vulvar itching Excoriations and erythema on outer labia minora Suspect lichen sclerosus Sent clobetasol cream Return for vulvar biopsy  5. Routine screening for STI (sexually transmitted infection) - Hepatitis B surface antigen - Hepatitis C antibody - HIV Antibody (routine testing w rflx) - RPR -  Cervicovaginal ancillary only( [place])   Will follow up results of pap smear/STI screen and manage accordingly. Encouraged improvement in diet and exercise.  COVID vaccine UTD Accepts STI screen. Mammogram UTD Referral for colonoscopy n/a - UTD Flu vaccine UTD DEXA not due- next due 2025  Routine preventative health maintenance measures emphasized. Please refer to After Visit Summary for other counseling recommendations.    Feliz Beam, MD, [place] for [facility])

## 2020-10-24 LAB — HEPATITIS C ANTIBODY: Hep C Virus Ab: <0.1 s/co ratio (ref 0.0–0.9)

## 2020-10-24 LAB — CERVICOVAGINAL ANCILLARY ONLY
Bacterial Vaginitis (gardnerella): NEGATIVE
Candida Glabrata: NEGATIVE
Candida Vaginitis: NEGATIVE
Chlamydia: NEGATIVE
Comment: NEGATIVE
Comment: NEGATIVE
Comment: NEGATIVE
Comment: NEGATIVE
Comment: NEGATIVE
Comment: NORMAL
Neisseria Gonorrhea: NEGATIVE
Trichomonas: NEGATIVE

## 2020-10-24 LAB — RPR: RPR Ser Ql: NONREACTIVE

## 2020-10-24 LAB — HEPATITIS B SURFACE ANTIGEN: Hepatitis B Surface Ag: NEGATIVE

## 2020-10-24 LAB — HIV ANTIBODY (ROUTINE TESTING W REFLEX): HIV Screen 4th Generation wRfx: NONREACTIVE

## 2020-10-25 ENCOUNTER — Telehealth: Payer: Self-pay | Admitting: General Practice

## 2020-10-25 NOTE — Telephone Encounter (Signed)
Patient walked in office today and requested for appointment to be cancelled that was scheduled on 11/08/2020 for vulvar biopsy.  Offered to reschedule, pt declined and stated that she did not want to go through with procedure.

## 2020-10-28 LAB — CYTOLOGY - PAP
Comment: NEGATIVE
Comment: NEGATIVE
Diagnosis: NEGATIVE
HPV 16: NEGATIVE
HPV 18 / 45: NEGATIVE
High risk HPV: POSITIVE — AB

## 2020-10-29 ENCOUNTER — Telehealth: Payer: Self-pay

## 2020-10-29 NOTE — Telephone Encounter (Signed)
Patient made aware of need for colposcopy. Patient scheduled with Dr. Ihor Dow on March 28th. Kathrene Alu RN

## 2020-10-29 NOTE — Telephone Encounter (Signed)
Called patient and left message for her to return call to office. Patient needs colposcopy per Dr. Rosana Hoes. Kathrene Alu RN

## 2020-10-29 NOTE — Telephone Encounter (Signed)
-----   Message from Asencion Islam, RN sent at 10/28/2020 11:09 AM EST -----  ----- Message ----- From: Sloan Leiter, MD Sent: 10/28/2020  11:08 AM EST To: [place], please get scheduled

## 2020-11-08 ENCOUNTER — Ambulatory Visit: Payer: 59 | Admitting: Family Medicine

## 2020-12-02 ENCOUNTER — Encounter: Payer: Self-pay | Admitting: Obstetrics & Gynecology

## 2020-12-02 ENCOUNTER — Other Ambulatory Visit: Payer: Self-pay

## 2020-12-02 ENCOUNTER — Encounter (INDEPENDENT_AMBULATORY_CARE_PROVIDER_SITE_OTHER): Payer: 59 | Admitting: Obstetrics & Gynecology

## 2020-12-02 NOTE — Progress Notes (Signed)
Pt left without being seen  Carolyn L. Harraway-Smith, M.D., Marissa Grant

## 2020-12-25 DIAGNOSIS — Z135 Encounter for screening for eye and ear disorders: Secondary | ICD-10-CM | POA: Diagnosis not present

## 2020-12-25 DIAGNOSIS — H11002 Unspecified pterygium of left eye: Secondary | ICD-10-CM | POA: Diagnosis not present

## 2020-12-25 DIAGNOSIS — H524 Presbyopia: Secondary | ICD-10-CM | POA: Diagnosis not present

## 2021-01-03 ENCOUNTER — Other Ambulatory Visit (HOSPITAL_COMMUNITY): Payer: Self-pay

## 2021-01-03 DIAGNOSIS — G629 Polyneuropathy, unspecified: Secondary | ICD-10-CM | POA: Diagnosis not present

## 2021-01-03 MED ORDER — DULOXETINE HCL 30 MG PO CPEP
60.0000 mg | ORAL_CAPSULE | Freq: Every day | ORAL | 6 refills | Status: DC
  Filled 2021-01-03: qty 60, 30d supply, fill #0
  Filled 2021-02-11: qty 60, 30d supply, fill #1
  Filled 2021-03-13: qty 60, 30d supply, fill #2
  Filled 2021-06-06: qty 60, 30d supply, fill #3

## 2021-01-03 MED ORDER — LIDOCAINE (ANORECTAL) 5 % EX CREA
1.0000 "application " | TOPICAL_CREAM | Freq: Two times a day (BID) | CUTANEOUS | 5 refills | Status: DC | PRN
  Filled 2021-01-08: qty 45, 20d supply, fill #0
  Filled 2021-06-06: qty 30, 20d supply, fill #0
  Filled 2021-06-06: qty 45, 25d supply, fill #0

## 2021-01-07 ENCOUNTER — Other Ambulatory Visit (HOSPITAL_COMMUNITY): Payer: Self-pay

## 2021-01-08 ENCOUNTER — Other Ambulatory Visit (HOSPITAL_COMMUNITY): Payer: Self-pay

## 2021-02-12 ENCOUNTER — Other Ambulatory Visit (HOSPITAL_COMMUNITY): Payer: Self-pay

## 2021-03-05 DIAGNOSIS — H524 Presbyopia: Secondary | ICD-10-CM | POA: Diagnosis not present

## 2021-03-13 ENCOUNTER — Other Ambulatory Visit (HOSPITAL_COMMUNITY): Payer: Self-pay

## 2021-05-02 DIAGNOSIS — R209 Unspecified disturbances of skin sensation: Secondary | ICD-10-CM | POA: Diagnosis not present

## 2021-05-02 DIAGNOSIS — R208 Other disturbances of skin sensation: Secondary | ICD-10-CM | POA: Diagnosis not present

## 2021-05-02 DIAGNOSIS — R202 Paresthesia of skin: Secondary | ICD-10-CM | POA: Diagnosis not present

## 2021-05-02 DIAGNOSIS — G629 Polyneuropathy, unspecified: Secondary | ICD-10-CM | POA: Diagnosis not present

## 2021-06-06 ENCOUNTER — Other Ambulatory Visit (HOSPITAL_COMMUNITY): Payer: Self-pay

## 2021-06-09 ENCOUNTER — Other Ambulatory Visit (HOSPITAL_COMMUNITY): Payer: Self-pay

## 2021-06-12 DIAGNOSIS — Z1231 Encounter for screening mammogram for malignant neoplasm of breast: Secondary | ICD-10-CM | POA: Diagnosis not present

## 2021-06-12 LAB — HM MAMMOGRAPHY

## 2021-06-13 ENCOUNTER — Encounter: Payer: Self-pay | Admitting: Internal Medicine

## 2021-06-20 ENCOUNTER — Other Ambulatory Visit (HOSPITAL_COMMUNITY): Payer: Self-pay

## 2021-06-20 DIAGNOSIS — R208 Other disturbances of skin sensation: Secondary | ICD-10-CM | POA: Diagnosis not present

## 2021-06-20 DIAGNOSIS — Z79899 Other long term (current) drug therapy: Secondary | ICD-10-CM | POA: Diagnosis not present

## 2021-06-20 DIAGNOSIS — G959 Disease of spinal cord, unspecified: Secondary | ICD-10-CM | POA: Diagnosis not present

## 2021-06-20 MED ORDER — DULOXETINE HCL 30 MG PO CPEP
60.0000 mg | ORAL_CAPSULE | Freq: Every day | ORAL | 6 refills | Status: DC
  Filled 2021-06-20 – 2021-07-09 (×2): qty 60, 30d supply, fill #0
  Filled 2021-10-03: qty 60, 30d supply, fill #1
  Filled 2021-11-17: qty 60, 30d supply, fill #2
  Filled 2022-02-02: qty 60, 30d supply, fill #3
  Filled 2022-03-02: qty 60, 30d supply, fill #4
  Filled 2022-05-12: qty 60, 30d supply, fill #5
  Filled 2022-06-17: qty 60, 30d supply, fill #6

## 2021-07-08 DIAGNOSIS — M5124 Other intervertebral disc displacement, thoracic region: Secondary | ICD-10-CM | POA: Diagnosis not present

## 2021-07-08 DIAGNOSIS — M50221 Other cervical disc displacement at C4-C5 level: Secondary | ICD-10-CM | POA: Diagnosis not present

## 2021-07-08 DIAGNOSIS — Z0389 Encounter for observation for other suspected diseases and conditions ruled out: Secondary | ICD-10-CM | POA: Diagnosis not present

## 2021-07-08 DIAGNOSIS — M47819 Spondylosis without myelopathy or radiculopathy, site unspecified: Secondary | ICD-10-CM | POA: Diagnosis not present

## 2021-07-08 DIAGNOSIS — M4802 Spinal stenosis, cervical region: Secondary | ICD-10-CM | POA: Diagnosis not present

## 2021-07-08 DIAGNOSIS — M5382 Other specified dorsopathies, cervical region: Secondary | ICD-10-CM | POA: Diagnosis not present

## 2021-07-08 DIAGNOSIS — M50222 Other cervical disc displacement at C5-C6 level: Secondary | ICD-10-CM | POA: Diagnosis not present

## 2021-07-08 DIAGNOSIS — M50223 Other cervical disc displacement at C6-C7 level: Secondary | ICD-10-CM | POA: Diagnosis not present

## 2021-07-08 DIAGNOSIS — M4804 Spinal stenosis, thoracic region: Secondary | ICD-10-CM | POA: Diagnosis not present

## 2021-07-08 DIAGNOSIS — M5030 Other cervical disc degeneration, unspecified cervical region: Secondary | ICD-10-CM | POA: Diagnosis not present

## 2021-07-09 ENCOUNTER — Other Ambulatory Visit (HOSPITAL_BASED_OUTPATIENT_CLINIC_OR_DEPARTMENT_OTHER): Payer: Self-pay

## 2021-07-10 ENCOUNTER — Other Ambulatory Visit (HOSPITAL_BASED_OUTPATIENT_CLINIC_OR_DEPARTMENT_OTHER): Payer: Self-pay

## 2021-10-03 ENCOUNTER — Encounter: Payer: Self-pay | Admitting: Internal Medicine

## 2021-10-03 ENCOUNTER — Other Ambulatory Visit (HOSPITAL_BASED_OUTPATIENT_CLINIC_OR_DEPARTMENT_OTHER): Payer: Self-pay

## 2021-10-03 ENCOUNTER — Ambulatory Visit (INDEPENDENT_AMBULATORY_CARE_PROVIDER_SITE_OTHER): Payer: 59 | Admitting: Internal Medicine

## 2021-10-03 VITALS — BP 136/80 | HR 75 | Temp 98.3°F | Resp 16 | Ht 59.0 in | Wt 131.0 lb

## 2021-10-03 DIAGNOSIS — Z23 Encounter for immunization: Secondary | ICD-10-CM

## 2021-10-03 DIAGNOSIS — Z0000 Encounter for general adult medical examination without abnormal findings: Secondary | ICD-10-CM

## 2021-10-03 DIAGNOSIS — R399 Unspecified symptoms and signs involving the genitourinary system: Secondary | ICD-10-CM

## 2021-10-03 DIAGNOSIS — R739 Hyperglycemia, unspecified: Secondary | ICD-10-CM

## 2021-10-03 LAB — URINALYSIS, ROUTINE W REFLEX MICROSCOPIC
Bilirubin Urine: NEGATIVE
Hgb urine dipstick: NEGATIVE
Ketones, ur: NEGATIVE
Leukocytes,Ua: NEGATIVE
Nitrite: NEGATIVE
RBC / HPF: NONE SEEN
Specific Gravity, Urine: 1.02 (ref 1.000–1.030)
Total Protein, Urine: NEGATIVE
Urine Glucose: NEGATIVE
Urobilinogen, UA: 0.2 (ref 0.0–1.0)
WBC, UA: NONE SEEN
pH: 7 (ref 5.0–8.0)

## 2021-10-03 LAB — COMPREHENSIVE METABOLIC PANEL
ALT: 19 U/L (ref 0–35)
AST: 21 U/L (ref 0–37)
Albumin: 4.5 g/dL (ref 3.5–5.2)
Alkaline Phosphatase: 62 U/L (ref 39–117)
BUN: 17 mg/dL (ref 6–23)
CO2: 30 mEq/L (ref 19–32)
Calcium: 9.8 mg/dL (ref 8.4–10.5)
Chloride: 106 mEq/L (ref 96–112)
Creatinine, Ser: 0.71 mg/dL (ref 0.40–1.20)
GFR: 90.08 mL/min (ref >60.00)
Glucose, Bld: 103 mg/dL — ABNORMAL HIGH (ref 70–99)
Potassium: 4.4 mEq/L (ref 3.5–5.1)
Sodium: 142 mEq/L (ref 135–145)
Total Bilirubin: 1 mg/dL (ref 0.2–1.2)
Total Protein: 7.3 g/dL (ref 6.0–8.3)

## 2021-10-03 LAB — CBC WITH DIFFERENTIAL/PLATELET
Basophils Absolute: 0 K/uL (ref 0.0–0.1)
Basophils Relative: 0.3 % (ref 0.0–3.0)
Eosinophils Absolute: 0 K/uL (ref 0.0–0.7)
Eosinophils Relative: 0.5 % (ref 0.0–5.0)
HCT: 42.8 % (ref 36.0–46.0)
Hemoglobin: 13.9 g/dL (ref 12.0–15.0)
Lymphocytes Relative: 23.6 % (ref 12.0–46.0)
Lymphs Abs: 1.2 K/uL (ref 0.7–4.0)
MCHC: 32.5 g/dL (ref 30.0–36.0)
MCV: 88.8 fl (ref 78.0–100.0)
Monocytes Absolute: 0.3 K/uL (ref 0.1–1.0)
Monocytes Relative: 5.2 % (ref 3.0–12.0)
Neutro Abs: 3.6 K/uL (ref 1.4–7.7)
Neutrophils Relative %: 70.4 % (ref 43.0–77.0)
Platelets: 237 K/uL (ref 150.0–400.0)
RBC: 4.82 Mil/uL (ref 3.87–5.11)
RDW: 13.8 % (ref 11.5–15.5)
WBC: 5.1 K/uL (ref 4.0–10.5)

## 2021-10-03 LAB — LIPID PANEL
Cholesterol: 173 mg/dL (ref 0–200)
HDL: 65.3 mg/dL
LDL Cholesterol: 95 mg/dL (ref 0–99)
NonHDL: 107.73
Total CHOL/HDL Ratio: 3
Triglycerides: 63 mg/dL (ref 0.0–149.0)
VLDL: 12.6 mg/dL (ref 0.0–40.0)

## 2021-10-03 LAB — HEMOGLOBIN A1C: Hgb A1c MFr Bld: 6.1 % (ref 4.6–6.5)

## 2021-10-03 NOTE — Progress Notes (Signed)
Subjective:    Patient ID: Marissa Grant, female    DOB: 1956/02/10, 66 y.o.   MRN: [id number]  DOS:  10/03/2021 Type of visit - description: CPX  Here for CPX. Feeling well. Continue with neuropathy symptoms.Chart reviewed. Also reports a long history of urinary frequency without dysuria, gross hematuria or abdominal pain   Review of Systems  Other than above, a 14 point review of systems is negative     Past Medical History:  Diagnosis Date   Acid reflux    Chicken pox as a child   Osteopenia     Past Surgical History:  Procedure Laterality Date   ABDOMINAL HYSTERECTOMY  1992   ?oophotrectomy   CHOLECYSTECTOMY N/A 05/26/2019   Procedure: LAPAROSCOPIC CHOLECYSTECTOMY;  Surgeon: Ralene Ok, MD;  Location: [facility];  Service: General;  Laterality: N/A;   COLONOSCOPY  2011   Dr.Peters    Foot surgery - Cotton, Gastroc Left 09/01/2012   1st ray elevatus, Equinus Lt   TUBAL LIGATION     Hysterectomy documted previously- Patient denies hysterectomy, claims she still has uterus and ovaries 03/24/2019   Social History   Socioeconomic History   Marital status: Widowed    Spouse name: Not on file   Number of children: 2   Years of education: Not on file   Highest education level: Not on file  Occupational History   Occupation: Chartered certified accountant    Employer: [employer]  Tobacco Use   Smoking status: Never   Smokeless tobacco: Never  Vaping Use   Vaping Use: Never used  Substance and Sexual Activity   Alcohol use: Yes    Alcohol/week: 0.0 standard drinks    Comment: rare wine   Drug use: No   Sexual activity: Not Currently    Birth control/protection: None  Other Topics Concern   Not on file  Social History Narrative   Original from [place] in the [place]   Household pt and daughter    2 children   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   Social Determinants of Health   Financial Resource Strain: Not on file  Food Insecurity: Not on file  Transportation  Needs: Not on file  Physical Activity: Not on file  Stress: Not on file  Social Connections: Not on file  Intimate Partner Violence: Not on file    Current Outpatient Medications  Medication Instructions   cholecalciferol (VITAMIN D3) 1,000 Units, Oral, Daily   DULoxetine (CYMBALTA) 60 mg, Oral, Daily   ibuprofen (ADVIL) 200 mg, Every 6 hours PRN   vitamin C 1,000 mg, Oral, Daily       Objective:   Physical Exam BP 136/80 (BP Location: Left Arm, Patient Position: Sitting, Cuff Size: Small)    Pulse 75    Temp 98.3 F (36.8 C) (Oral)    Resp 16    Ht 4\' 11"  (1.499 m)    Wt 131 lb (59.4 kg)    SpO2 98%    BMI 26.46 kg/m  General: Well developed, NAD, BMI noted Neck: No  thyromegaly  HEENT:  Normocephalic . Face symmetric, atraumatic Lungs:  CTA B Normal respiratory effort, no intercostal retractions, no accessory muscle use. Heart: RRR,  no murmur.  Abdomen:  Not distended, soft, non-tender. No rebound or rigidity.   Lower extremities: no pretibial edema bilaterally  Skin: Exposed areas without rash. Not pale. Not jaundice Neurologic:  alert & oriented X3.  Speech normal, gait appropriate for age and unassisted Strength  symmetric and appropriate for age.  Psych: Cognition and judgment appear intact.  Cooperative with normal attention span and concentration.  Behavior appropriate. No anxious or depressed appearing.     Assessment      ASSESSMENT  Hyperglycemia, A1c is 5.7 (2015) GERD-dyspepsia, chronic: saw GI , EGD (-) 2014 Neuropathy: 2016 labs (-), rx gabapentin (mild response but intolerant); saw neuro 2017;  NCS 10-2015 (-); ABIs wnl 12-2015; saw neuro surgery 12/28, 11/2017 MRI: mild DJD Osteopenia:   Tscore -1.8 (03-2014) , T score -1.9 (09-2018) on ca and vit d  PLAN: Here for CPX Hyperglycemia: Has a healthy lifestyle, check A1c Osteopenia: On calcium, vitamin D, next DEXA 2025. Neuropathy: Temporarily held Cymbalta and symptoms increased, she is back on  Cymbalta and symptoms are back to baseline. Saw neurology at [facility] 06/20/2021: Had labs x-rays, MRI C-spine,  1. Generally mild for age cervical spine degeneration.  2. Small disc herniations at C5-C6 and C6-C7. Mild ventral cord mass effect at the former although no significant  spinal stenosis. Mild to moderate proximal right foraminal stenosis at the latter, query right C7 radiculitis.  3. Thoracic levels reported separately.  MRI T-spine (normal for age) Recommend to discuss results with neurology, she has not been called. LUTS: Urinary frequency, this is going on for a while.  Plans to discuss with gynecology.  Check UA urine culture. RTC 1 year      This visit occurred during the SARS-CoV-2 public health emergency.  Safety protocols were in place, including screening questions prior to the visit, additional usage of staff PPE, and extensive cleaning of exam room while observing appropriate contact time as indicated for disinfecting solutions.

## 2021-10-03 NOTE — Patient Instructions (Addendum)
Please read information about advance care planning  Schedule your second Shingrix vaccine anytime between 2 and 4 months from today next  GO TO THE LAB : Get the blood work     [place], Marissa Grant back for a physical exam in 1 year

## 2021-10-03 NOTE — Assessment & Plan Note (Signed)
-  Td 2015 -Shingrix: First dose today, next in 2 to 4 months. -COVID VAX booster recommended - Had a flu shot.   -CCS: Colonoscopy 2011 at [facility], told normal, C-scope again 12-2019, next per GI, Dr Ardis Hughs -Female care:  Sees gynecology.  MMG October 2022 (K PN) -Diet and exercise: Doing well. -Labs: CMP, FLP, CBC, A1c -ACP info provided

## 2021-10-03 NOTE — Assessment & Plan Note (Signed)
Here for CPX Hyperglycemia: Has a healthy lifestyle, check A1c Osteopenia: On calcium, vitamin D, next DEXA 2025. Neuropathy: Temporarily held Cymbalta and symptoms increased, she is back on Cymbalta and symptoms are back to baseline. Saw neurology at [facility] 06/20/2021: Had labs x-rays, MRI C-spine,  1. Generally mild for age cervical spine degeneration.  2. Small disc herniations at C5-C6 and C6-C7. Mild ventral cord mass effect at the former although no significant  spinal stenosis. Mild to moderate proximal right foraminal stenosis at the latter, query right C7 radiculitis.  3. Thoracic levels reported separately.  MRI T-spine (normal for age) Recommend to discuss results with neurology, she has not been called. LUTS: Urinary frequency, this is going on for a while.  Plans to discuss with gynecology.  Check UA urine culture. RTC 1 year

## 2021-10-04 LAB — URINE CULTURE
MICRO NUMBER:: 12929765
Result:: NO GROWTH
SPECIMEN QUALITY:: ADEQUATE

## 2021-11-17 ENCOUNTER — Other Ambulatory Visit (HOSPITAL_COMMUNITY)
Admission: RE | Admit: 2021-11-17 | Discharge: 2021-11-17 | Disposition: A | Discharge status: Home / Self Care | Payer: 59 | Source: Ambulatory Visit | Attending: Obstetrics & Gynecology | Admitting: Obstetrics & Gynecology

## 2021-11-17 ENCOUNTER — Other Ambulatory Visit: Payer: Self-pay

## 2021-11-17 ENCOUNTER — Encounter: Payer: Self-pay | Admitting: Obstetrics & Gynecology

## 2021-11-17 ENCOUNTER — Other Ambulatory Visit (HOSPITAL_BASED_OUTPATIENT_CLINIC_OR_DEPARTMENT_OTHER): Payer: Self-pay

## 2021-11-17 ENCOUNTER — Ambulatory Visit (INDEPENDENT_AMBULATORY_CARE_PROVIDER_SITE_OTHER): Payer: 59 | Admitting: Obstetrics & Gynecology

## 2021-11-17 VITALS — BP 121/62 | HR 92 | Ht <= 58 in | Wt 134.0 lb

## 2021-11-17 DIAGNOSIS — Z01419 Encounter for gynecological examination (general) (routine) without abnormal findings: Secondary | ICD-10-CM

## 2021-11-17 DIAGNOSIS — R87618 Other abnormal cytological findings on specimens from cervix uteri: Secondary | ICD-10-CM | POA: Insufficient documentation

## 2021-11-17 NOTE — Progress Notes (Signed)
? ? ?GYNECOLOGY ANNUAL PREVENTATIVE CARE ENCOUNTER NOTE ? ?History:    ? Marissa Grant is a 66 y.o. G74P2002 female here for a routine annual gynecologic exam.  Current complaints: none.   Denies abnormal vaginal bleeding, discharge, pelvic pain, problems with intercourse or other gynecologic concerns.  ?  ?Gynecologic History ?No LMP recorded. Patient is postmenopausal. ?Contraception: post menopausal status ?Last Pap: 10/23/2020. Result was normal with positive HPV ?Last Mammogram: 06/12/2021.  Result was normal ?Last Colonoscopy: 01/01/2020.  Result was normal ? ?Obstetric History ?OB History  ?Gravida Para Term Preterm AB Living  ?'2 2 2     2  '$ ?SAB IAB Ectopic Multiple Live Births  ?        2  ?  ?# Outcome Date GA Lbr Len/2nd Weight Sex Delivery Anes PTL Lv  ?2 Term 1991 [redacted]w[redacted]d  F Vag-Spont  N LIV  ?1 Term 1985 [id number] F Vag-Spont None N LIV  ? ? ?Past Medical History:  ?Diagnosis Date  ? Acid reflux   ? Chicken pox as a child  ? Osteopenia   ? ? ?Past Surgical History:  ?Procedure Laterality Date  ? CHOLECYSTECTOMY N/A 05/26/2019  ? Procedure: LAPAROSCOPIC CHOLECYSTECTOMY;  Surgeon: RaRalene OkMD;  Location: [facility];  Service: General;  Laterality: N/A;  ? COLONOSCOPY  2011  ? Dr.Peters   ? Foot surgery - Cotton, Gastroc Left 09/01/2012  ? 1st ray elevatus, Equinus Lt  ? TUBAL LIGATION    ? Hysterectomy documented previously- Patient denies hysterectomy, claims she still has uterus and ovaries 03/24/2019. These were also seen on CT scan in 2013  ? ? ?Current Outpatient Medications on File Prior to Visit  ?Medication Sig Dispense Refill  ? Ascorbic Acid (VITAMIN C) 1000 MG tablet Take 1,000 mg by mouth daily.    ? cholecalciferol (VITAMIN D3) 25 MCG (1000 UT) tablet Take 1,000 Units by mouth daily.    ? DULoxetine (CYMBALTA) 30 MG capsule Take 2 capsules (60 mg total) by mouth daily. 60 capsule 6  ? ibuprofen (ADVIL) 200 MG tablet Take 200 mg by mouth every 6 (six) hours as needed. (Patient not taking: Reported  on 10/03/2021)    ? ?No current facility-administered medications on file prior to visit.  ? ? ?Allergies  ?Allergen Reactions  ? Pollen Extract Other (See Comments)  ?  Nasal congestion.   ? ? ?Social History:  reports that she has never smoked. She has never used smokeless tobacco. She reports current alcohol use. She reports that she does not use drugs. ? ?Family History  ?Problem Relation Age of Onset  ? Alzheimer's disease Mother   ? Healthy Daughter   ? Colon cancer Neg Hx   ? Breast cancer Neg Hx   ? Diabetes Neg Hx   ? CAD Neg Hx   ? Colon polyps Neg Hx   ? Esophageal cancer Neg Hx   ? Rectal cancer Neg Hx   ? Stomach cancer Neg Hx   ? ? ?The following portions of the patient's history were reviewed and updated as appropriate: allergies, current medications, past family history, past medical history, past social history, past surgical history and problem list. ? ?Review of Systems ?Pertinent items noted in HPI and remainder of comprehensive ROS otherwise negative. ? ?Physical Exam:  ?BP 121/62   Pulse 92   Ht '4\' 10"'$  (1.473 m)   Wt 134 lb (60.8 kg)   BMI 28.01 kg/m?  ?CONSTITUTIONAL: Well-developed, well-nourished female in no acute  distress.  ?HENT:  Normocephalic, atraumatic, External right and left ear normal.  ?EYES: Conjunctivae and EOM are normal. Pupils are equal, round, and reactive to light. No scleral icterus.  ?NECK: Normal range of motion, supple, no masses.  Normal thyroid.  ?SKIN: Skin is warm and dry. No rash noted. Not diaphoretic. No erythema. No pallor. ?MUSCULOSKELETAL: Normal range of motion. No tenderness.  No cyanosis, clubbing, or edema. ?NEUROLOGIC: Alert and oriented to person, place, and time. Normal reflexes, muscle tone coordination.  ?PSYCHIATRIC: Normal mood and affect. Normal behavior. Normal judgment and thought content. ?CARDIOVASCULAR: Normal heart rate noted, regular rhythm ?RESPIRATORY: Clear to auscultation bilaterally. Effort and breath sounds normal, no problems with  respiration noted. ?BREASTS: Symmetric in size. No masses, tenderness, skin changes, nipple drainage, or lymphadenopathy bilaterally. Performed in the presence of a chaperone. ?ABDOMEN: Soft, no distention noted.  No tenderness, rebound or guarding.  ?PELVIC: Normal appearing external genitalia and urethral meatus; normal appearing vaginal mucosa and cervix with moderate atrophy.  No abnormal vaginal discharge noted.  Pap smear obtained.  Normal uterine size, no other palpable masses, no uterine or adnexal tenderness.  Performed in the presence of a chaperone. ?  ?Assessment and Plan:  ?   ?1. Pap smear abnormality of cervix/human papillomavirus (HPV) positive ?2. Well woman exam ?- Cytology - PAP ?Will follow up results of pap smear and manage accordingly. ?Mammogram and colon cancer screening are up to date. ?Routine preventative health maintenance measures emphasized. ?Please refer to After Visit Summary for other counseling recommendations.  ?   ? ?Verita Schneiders, MD, FACOG ?Obstetrician Social research officer, government, Faculty Practice ?[facility] ? ?

## 2021-11-21 ENCOUNTER — Encounter: Payer: Self-pay | Admitting: Obstetrics & Gynecology

## 2021-11-21 DIAGNOSIS — R87820 Cervical low risk human papillomavirus (HPV) DNA test positive: Secondary | ICD-10-CM | POA: Insufficient documentation

## 2021-11-21 LAB — CYTOLOGY - PAP
Comment: NEGATIVE
Comment: NEGATIVE
Diagnosis: NEGATIVE
HPV 16: NEGATIVE
HPV 18 / 45: NEGATIVE
High risk HPV: POSITIVE — AB

## 2022-01-22 ENCOUNTER — Ambulatory Visit (INDEPENDENT_AMBULATORY_CARE_PROVIDER_SITE_OTHER): Payer: 59

## 2022-01-22 DIAGNOSIS — Z23 Encounter for immunization: Secondary | ICD-10-CM | POA: Diagnosis not present

## 2022-02-03 ENCOUNTER — Other Ambulatory Visit (HOSPITAL_BASED_OUTPATIENT_CLINIC_OR_DEPARTMENT_OTHER): Payer: Self-pay

## 2022-02-03 ENCOUNTER — Other Ambulatory Visit (HOSPITAL_COMMUNITY): Payer: Self-pay

## 2022-03-02 ENCOUNTER — Other Ambulatory Visit (HOSPITAL_COMMUNITY): Payer: Self-pay

## 2022-04-09 DIAGNOSIS — H11002 Unspecified pterygium of left eye: Secondary | ICD-10-CM | POA: Diagnosis not present

## 2022-04-09 DIAGNOSIS — H524 Presbyopia: Secondary | ICD-10-CM | POA: Diagnosis not present

## 2022-04-09 DIAGNOSIS — Z135 Encounter for screening for eye and ear disorders: Secondary | ICD-10-CM | POA: Diagnosis not present

## 2022-05-12 ENCOUNTER — Other Ambulatory Visit (HOSPITAL_COMMUNITY): Payer: Self-pay

## 2022-06-17 ENCOUNTER — Other Ambulatory Visit (HOSPITAL_COMMUNITY): Payer: Self-pay

## 2022-06-18 DIAGNOSIS — Z1231 Encounter for screening mammogram for malignant neoplasm of breast: Secondary | ICD-10-CM | POA: Diagnosis not present

## 2022-06-18 LAB — HM MAMMOGRAPHY

## 2022-06-22 ENCOUNTER — Encounter: Payer: Self-pay | Admitting: Internal Medicine

## 2022-07-21 ENCOUNTER — Other Ambulatory Visit (HOSPITAL_BASED_OUTPATIENT_CLINIC_OR_DEPARTMENT_OTHER): Payer: Self-pay

## 2022-07-21 ENCOUNTER — Telehealth: Payer: Self-pay | Admitting: Internal Medicine

## 2022-07-21 ENCOUNTER — Other Ambulatory Visit (HOSPITAL_COMMUNITY): Payer: Self-pay

## 2022-07-21 MED ORDER — DULOXETINE HCL 30 MG PO CPEP
60.0000 mg | ORAL_CAPSULE | Freq: Every day | ORAL | 1 refills | Status: DC
  Filled 2022-07-21 – 2022-07-22 (×2): qty 180, 90d supply, fill #0

## 2022-07-21 NOTE — Telephone Encounter (Signed)
Rx sent 

## 2022-07-21 NOTE — Telephone Encounter (Signed)
Refill on: Rx #: [id number]  DULoxetine (CYMBALTA) 30 MG capsule [[id number]]   Pharm: [facility]

## 2022-07-22 ENCOUNTER — Other Ambulatory Visit (HOSPITAL_BASED_OUTPATIENT_CLINIC_OR_DEPARTMENT_OTHER): Payer: Self-pay

## 2022-07-22 ENCOUNTER — Encounter: Payer: Self-pay | Admitting: Family Medicine

## 2022-07-22 ENCOUNTER — Other Ambulatory Visit (HOSPITAL_COMMUNITY): Payer: Self-pay

## 2022-07-22 ENCOUNTER — Ambulatory Visit: Payer: 59 | Admitting: Family Medicine

## 2022-07-22 VITALS — BP 132/80 | HR 91 | Temp 98.5°F | Ht <= 58 in | Wt 134.0 lb

## 2022-07-22 DIAGNOSIS — J40 Bronchitis, not specified as acute or chronic: Secondary | ICD-10-CM | POA: Diagnosis not present

## 2022-07-22 DIAGNOSIS — J329 Chronic sinusitis, unspecified: Secondary | ICD-10-CM | POA: Diagnosis not present

## 2022-07-22 MED ORDER — BENZONATATE 200 MG PO CAPS
200.0000 mg | ORAL_CAPSULE | Freq: Two times a day (BID) | ORAL | 0 refills | Status: DC | PRN
  Filled 2022-07-22: qty 20, 10d supply, fill #0

## 2022-07-22 MED ORDER — GUAIFENESIN ER 600 MG PO TB12
1200.0000 mg | ORAL_TABLET | Freq: Two times a day (BID) | ORAL | 2 refills | Status: DC
  Filled 2022-07-22: qty 30, 8d supply, fill #0

## 2022-07-22 MED ORDER — AMOXICILLIN-POT CLAVULANATE 875-125 MG PO TABS
1.0000 | ORAL_TABLET | Freq: Two times a day (BID) | ORAL | 0 refills | Status: DC
  Filled 2022-07-22: qty 20, 10d supply, fill #0

## 2022-07-22 NOTE — Patient Instructions (Signed)
Given duration of symptoms, adding Augmentin, Mucinex, and tessalon.  Continue supportive measures including rest, hydration, humidifier use, steam showers, warm compresses to sinuses, warm liquids with lemon and honey, and over-the-counter cough, cold, and analgesics as needed.   If not noticing improvement after the first few days of antibiotics, please let us know. Consider chest xray if failure to improve.

## 2022-07-22 NOTE — Progress Notes (Signed)
Acute Office Visit  Subjective:     Patient ID: Marissa Grant, female    DOB: [birth year], 66 y.o.   MRN: [id number]  Chief Complaint  Patient presents with   Cough    X 1 month    Cough This is a new problem. The current episode started 1 to 4 weeks ago (3-4 weeks). The problem has been unchanged. The problem occurs every few hours. The cough is Productive of purulent sputum. Associated symptoms include ear congestion, headaches, nasal congestion, postnasal drip and rhinorrhea. Pertinent negatives include no chest pain, chills, ear pain, fever, heartburn, hemoptysis, myalgias, rash, sore throat, shortness of breath, sweats, weight loss or wheezing. Nothing aggravates the symptoms. Treatments tried: Robitussin, lemon ginger tea, honey, herbal remedies. The treatment provided mild relief. There is no history of asthma, bronchiectasis, COPD, emphysema or pneumonia.     All review of systems negative except what is listed in the HPI      Objective:    BP 132/80 (BP Location: Left Arm, Patient Position: Sitting)   Pulse 91   Temp 98.5 F (36.9 C) (Oral)   Ht '4\' 10"'$  (1.473 m)   Wt 134 lb (60.8 kg)   SpO2 96%   BMI 28.01 kg/m    Physical Exam Constitutional:      General: She is not in acute distress.    Appearance: Normal appearance. She is normal weight. She is not ill-appearing.  HENT:     Head: Normocephalic and atraumatic.     Right Ear: Tympanic membrane normal.     Left Ear: Tympanic membrane normal.     Nose: Congestion and rhinorrhea present.     Mouth/Throat:     Mouth: Mucous membranes are moist.     Pharynx: Oropharynx is clear. No oropharyngeal exudate or posterior oropharyngeal erythema.  Eyes:     Extraocular Movements: Extraocular movements intact.     Pupils: Pupils are equal, round, and reactive to light.  Cardiovascular:     Rate and Rhythm: Normal rate and regular rhythm.  Pulmonary:     Effort: Pulmonary effort is normal.     Breath sounds: Normal  breath sounds. No wheezing, rhonchi or rales.  Musculoskeletal:     Cervical back: Normal range of motion and neck supple.  Lymphadenopathy:     Cervical: No cervical adenopathy.  Neurological:     Mental Status: She is alert and oriented to person, place, and time.  Psychiatric:        Mood and Affect: Mood normal.        Behavior: Behavior normal.        Thought Content: Thought content normal.        Judgment: Judgment normal.       No results found for any visits on 07/22/22.      Assessment & Plan:   Problem List Items Addressed This Visit   None Visit Diagnoses     Sinobronchitis    -  Primary Given duration of symptoms, adding Augmentin, Mucinex, and tessalon.  Continue supportive measures including rest, hydration, humidifier use, steam showers, warm compresses to sinuses, warm liquids with lemon and honey, and over-the-counter cough, cold, and analgesics as needed.   If not noticing improvement after the first few days of antibiotics, please let us know. Consider chest xray if failure to improve.    Relevant Medications   amoxicillin-clavulanate (AUGMENTIN) 875-125 MG tablet   benzonatate (TESSALON) 200 MG capsule   guaiFENesin (MUCINEX) 600 MG 12 hr  tablet       Meds ordered this encounter  Medications   amoxicillin-clavulanate (AUGMENTIN) 875-125 MG tablet    Sig: Take 1 tablet by mouth 2 (two) times daily.    Dispense:  20 tablet    Refill:  0    Order Specific Question:   Supervising Provider    Answer:   Penni Homans A [[id number]]   benzonatate (TESSALON) 200 MG capsule    Sig: Take 1 capsule (200 mg total) by mouth 2 (two) times daily as needed for cough.    Dispense:  20 capsule    Refill:  0    Order Specific Question:   Supervising Provider    Answer:   Penni Homans A [[id number]]   guaiFENesin (MUCINEX) 600 MG 12 hr tablet    Sig: Take 2 tablets (1,200 mg total) by mouth 2 (two) times daily.    Dispense:  30 tablet    Refill:  2    Order Specific  Question:   Supervising Provider    Answer:   Penni Homans A [[id number]]    Return if symptoms worsen or fail to improve.  Terrilyn Saver, NP

## 2022-10-05 ENCOUNTER — Ambulatory Visit (INDEPENDENT_AMBULATORY_CARE_PROVIDER_SITE_OTHER): Payer: Medicare HMO | Admitting: Internal Medicine

## 2022-10-05 ENCOUNTER — Encounter: Payer: Self-pay | Admitting: Internal Medicine

## 2022-10-05 ENCOUNTER — Other Ambulatory Visit (HOSPITAL_COMMUNITY): Payer: Self-pay

## 2022-10-05 ENCOUNTER — Other Ambulatory Visit (HOSPITAL_BASED_OUTPATIENT_CLINIC_OR_DEPARTMENT_OTHER): Payer: Self-pay

## 2022-10-05 VITALS — BP 122/80 | HR 74 | Temp 98.1°F | Resp 16 | Ht <= 58 in | Wt 137.4 lb

## 2022-10-05 DIAGNOSIS — R739 Hyperglycemia, unspecified: Secondary | ICD-10-CM | POA: Diagnosis not present

## 2022-10-05 DIAGNOSIS — G609 Hereditary and idiopathic neuropathy, unspecified: Secondary | ICD-10-CM

## 2022-10-05 DIAGNOSIS — Z780 Asymptomatic menopausal state: Secondary | ICD-10-CM

## 2022-10-05 DIAGNOSIS — Z23 Encounter for immunization: Secondary | ICD-10-CM

## 2022-10-05 DIAGNOSIS — Z0001 Encounter for general adult medical examination with abnormal findings: Secondary | ICD-10-CM

## 2022-10-05 DIAGNOSIS — Z0000 Encounter for general adult medical examination without abnormal findings: Secondary | ICD-10-CM

## 2022-10-05 DIAGNOSIS — K219 Gastro-esophageal reflux disease without esophagitis: Secondary | ICD-10-CM

## 2022-10-05 LAB — CBC WITH DIFFERENTIAL/PLATELET
Basophils Absolute: 0.1 10*3/uL (ref 0.0–0.1)
Basophils Relative: 0.8 % (ref 0.0–3.0)
Eosinophils Absolute: 0.1 10*3/uL (ref 0.0–0.7)
Eosinophils Relative: 1.1 % (ref 0.0–5.0)
HCT: 41.1 % (ref 36.0–46.0)
Hemoglobin: 13.9 g/dL (ref 12.0–15.0)
Lymphocytes Relative: 26.1 % (ref 12.0–46.0)
Lymphs Abs: 1.9 10*3/uL (ref 0.7–4.0)
MCHC: 33.7 g/dL (ref 30.0–36.0)
MCV: 87.9 fl (ref 78.0–100.0)
Monocytes Absolute: 0.4 10*3/uL (ref 0.1–1.0)
Monocytes Relative: 5.5 % (ref 3.0–12.0)
Neutro Abs: 4.9 10*3/uL (ref 1.4–7.7)
Neutrophils Relative %: 66.5 % (ref 43.0–77.0)
Platelets: 273 10*3/uL (ref 150.0–400.0)
RBC: 4.68 Mil/uL (ref 3.87–5.11)
RDW: 14 % (ref 11.5–15.5)
WBC: 7.4 10*3/uL (ref 4.0–10.5)

## 2022-10-05 LAB — COMPREHENSIVE METABOLIC PANEL
ALT: 23 U/L (ref 0–35)
AST: 18 U/L (ref 0–37)
Albumin: 4.2 g/dL (ref 3.5–5.2)
Alkaline Phosphatase: 71 U/L (ref 39–117)
BUN: 21 mg/dL (ref 6–23)
CO2: 29 mEq/L (ref 19–32)
Calcium: 9.2 mg/dL (ref 8.4–10.5)
Chloride: 105 mEq/L (ref 96–112)
Creatinine, Ser: 0.71 mg/dL (ref 0.40–1.20)
GFR: 89.44 mL/min (ref >60.00)
Glucose, Bld: 111 mg/dL — ABNORMAL HIGH (ref 70–99)
Potassium: 4.4 mEq/L (ref 3.5–5.1)
Sodium: 142 mEq/L (ref 135–145)
Total Bilirubin: 0.6 mg/dL (ref 0.2–1.2)
Total Protein: 7 g/dL (ref 6.0–8.3)

## 2022-10-05 LAB — TSH: TSH: 3.51 u[IU]/mL (ref 0.35–5.50)

## 2022-10-05 LAB — LIPID PANEL
Cholesterol: 165 mg/dL (ref 0–200)
HDL: 55.3 mg/dL (ref >39.00)
LDL Cholesterol: 95 mg/dL (ref 0–99)
NonHDL: 110.18
Total CHOL/HDL Ratio: 3
Triglycerides: 77 mg/dL (ref 0.0–149.0)
VLDL: 15.4 mg/dL (ref 0.0–40.0)

## 2022-10-05 LAB — HEMOGLOBIN A1C: Hgb A1c MFr Bld: 6.5 % (ref 4.6–6.5)

## 2022-10-05 MED ORDER — DULOXETINE HCL 30 MG PO CPEP
60.0000 mg | ORAL_CAPSULE | Freq: Every day | ORAL | 3 refills | Status: DC
  Filled 2022-10-05 (×2): qty 180, 90d supply, fill #0

## 2022-10-05 MED ORDER — AREXVY 120 MCG/0.5ML IM SUSR
INTRAMUSCULAR | 0 refills | Status: DC
  Filled 2022-10-05: qty 1, 1d supply, fill #0

## 2022-10-05 MED ORDER — PANTOPRAZOLE SODIUM 40 MG PO TBEC
40.0000 mg | DELAYED_RELEASE_TABLET | Freq: Every day | ORAL | 4 refills | Status: DC
  Filled 2022-10-05 (×2): qty 30, 30d supply, fill #0

## 2022-10-05 NOTE — Assessment & Plan Note (Signed)
-  Td 2015 - s/p  Shingrix - RSV discussed - PNM 20: today -COVID VAX ; booster recommended - Had a flu shot.   -CCS: Colonoscopy 2011 at [facility], told normal, C-scope again 12-2019, next 2028 per GI letter, Dr Ardis Hughs -Female control of:  Sees gynecology.  MMG October 2023 (K PN) -Diet and exercise: Counseled. -Labs: CMP FLP CBC A1c TSH -Healthcare POA: See AVS

## 2022-10-05 NOTE — Patient Instructions (Addendum)
Vaccines I recommend:  Covid booster RSV vaccine  Start taking pantoprazole 1 tablet before breakfast.  See if that helps with the on and off cough you have.  GO TO THE LAB : Get the blood work     [place], [place] back for a checkup in 3 months   "[place] of attorney" ,  "Living will" (Advance care planning documents)  If you already have a living will or healthcare power of attorney, is recommended you bring the copy to be scanned in your chart.   The document will be available to all the doctors you see in the system.  Advance care planning is a process that supports adults in  understanding and sharing their preferences regarding future medical care.  The patient's preferences are recorded in documents called Advance Directives and the can be modified at any time while the patient is in full mental capacity.   If you don't have one, please consider create one.      More information at: [email or web address]

## 2022-10-05 NOTE — Assessment & Plan Note (Addendum)
Here for CPX Hyperglycemia: Recheck A1c, consider metformin (a tighter control of glucose could help neuropathy perhaps) Cough: Dry cough on and off for more than a year, not a smoker, no history of asthma, still has GERD symptoms.  See next GERD-dyspepsia: Due to cough will do a trial with pantoprazole.  Reassess on RTC Neuropathy: Still an issue, request a refill for Cymbalta.  Will do.  Does not plan to see neurology for now. Osteopenia: Check a DEXA.  No history of vitamin D deficiency, on supplements Social: Patient retired. RTC 3 months

## 2022-10-05 NOTE — Progress Notes (Signed)
Subjective:    Patient ID: Marissa Grant, female    DOB: 05-25-1956, 67 y.o.   MRN: [id number]  DOS:  10/05/2022 Type of visit - description: cpx  Here for routine CPX. Neuropathy: Ongoing symptoms. Also has cough, on and off more than a year. Denies fever or chills. States she typically have some throat tickling and then she starts coughing.  Admits to occasional GERD symptoms. No chest pain or difficulty breathing  Review of Systems  Other than above, a 14 point review of systems is negative   Past Medical History:  Diagnosis Date   Acid reflux    Chicken pox as a child   Osteopenia     Past Surgical History:  Procedure Laterality Date   CHOLECYSTECTOMY N/A 05/26/2019   Procedure: LAPAROSCOPIC CHOLECYSTECTOMY;  Surgeon: Ralene Ok, MD;  Location: [facility];  Service: General;  Laterality: N/A;   COLONOSCOPY  2011   Dr.Peters    Foot surgery - Cotton, Gastroc Left 09/01/2012   1st ray elevatus, Equinus Lt   TUBAL LIGATION     Hysterectomy documented previously- Patient denies hysterectomy, claims she still has uterus and ovaries 03/24/2019. These were also seen on CT scan in 2013   Social History   Socioeconomic History   Marital status: Widowed    Spouse name: Not on file   Number of children: 2   Years of education: Not on file   Highest education level: Not on file  Occupational History   Occupation: Chartered certified accountant    Employer: [employer]  Tobacco Use   Smoking status: Never   Smokeless tobacco: Never  Vaping Use   Vaping Use: Never used  Substance and Sexual Activity   Alcohol use: Yes    Alcohol/week: 0.0 standard drinks of alcohol    Comment: rare wine   Drug use: No   Sexual activity: Not Currently    Birth control/protection: None  Other Topics Concern   Not on file  Social History Narrative   Original from [place] in the [place]   Household pt and daughter    2 children   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   Social Determinants of  Health   Financial Resource Strain: Not on file  Food Insecurity: Not on file  Transportation Needs: Not on file  Physical Activity: Not on file  Stress: Not on file  Social Connections: Not on file  Intimate Partner Violence: Not on file    Current Outpatient Medications  Medication Instructions   cholecalciferol (VITAMIN D3) 1,000 Units, Oral, Daily   DULoxetine (CYMBALTA) 60 mg, Oral, Daily   pantoprazole (PROTONIX) 40 mg, Oral, Daily before breakfast   RSV vaccine recomb adjuvanted (AREXVY) 120 MCG/0.5ML injection Intramuscular       Objective:   Physical Exam BP 122/80   Pulse 74   Temp 98.1 F (36.7 C) (Oral)   Resp 16   Ht '4\' 10"'$  (1.473 m)   Wt 137 lb 6 oz (62.3 kg)   SpO2 97%   BMI 28.71 kg/m  General: Well developed, NAD, BMI noted Neck: No  thyromegaly  HEENT:  Normocephalic . Face symmetric, atraumatic Lungs:  CTA B Normal respiratory effort, no intercostal retractions, no accessory muscle use. Heart: RRR,  no murmur.  Abdomen:  Not distended, soft, non-tender. No rebound or rigidity.   Lower extremities: no pretibial edema bilaterally  Skin: Exposed areas without rash. Not pale. Not jaundice Neurologic:  alert & oriented X3.  Speech normal,  gait appropriate for age and unassisted Strength symmetric and appropriate for age.  Psych: Cognition and judgment appear intact.  Cooperative with normal attention span and concentration.  Behavior appropriate. No anxious or depressed appearing.     Assessment    ASSESSMENT  Hyperglycemia, A1c is 5.7 (2015) GERD-dyspepsia, chronic: saw GI , EGD (-) 2014 Neuropathy: 2016 labs (-), rx gabapentin (mild response but intolerant); saw neuro 2017;  NCS 10-2015 (-); ABIs wnl 12-2015; saw neuro surgery 12/28, 11/2017 MRI: mild DJD. neurology [facility] 06/20/2021: labs, XR , MRI C-spine, T spine Osteopenia:   Tscore -1.8 (03-2014) , T score -1.9 (09-2018) on ca and vit d  PLAN: Here for CPX Hyperglycemia: Recheck A1c,  consider metformin (a tighter control of glucose could help neuropathy perhaps) Cough: Dry cough on and off for more than a year, not a smoker, no history of asthma, still has GERD symptoms.  See next GERD-dyspepsia: Due to cough will do a trial with pantoprazole.  Reassess on RTC Neuropathy: Still an issue, request a refill for Cymbalta.  Will do.  Does not plan to see neurology for now. Osteopenia: Check a DEXA.  No history of vitamin D deficiency, on supplements Social: Patient retired. RTC 3 months

## 2022-12-11 ENCOUNTER — Ambulatory Visit (INDEPENDENT_AMBULATORY_CARE_PROVIDER_SITE_OTHER): Payer: Medicare HMO | Admitting: Internal Medicine

## 2022-12-11 ENCOUNTER — Encounter: Payer: Self-pay | Admitting: Internal Medicine

## 2022-12-11 ENCOUNTER — Other Ambulatory Visit (HOSPITAL_BASED_OUTPATIENT_CLINIC_OR_DEPARTMENT_OTHER): Payer: Self-pay

## 2022-12-11 ENCOUNTER — Other Ambulatory Visit (HOSPITAL_COMMUNITY): Payer: Self-pay

## 2022-12-11 VITALS — BP 138/80 | HR 86 | Temp 98.6°F | Resp 16 | Ht <= 58 in | Wt 138.5 lb

## 2022-12-11 DIAGNOSIS — R739 Hyperglycemia, unspecified: Secondary | ICD-10-CM

## 2022-12-11 DIAGNOSIS — R21 Rash and other nonspecific skin eruption: Secondary | ICD-10-CM | POA: Diagnosis not present

## 2022-12-11 DIAGNOSIS — E119 Type 2 diabetes mellitus without complications: Secondary | ICD-10-CM | POA: Diagnosis not present

## 2022-12-11 DIAGNOSIS — K219 Gastro-esophageal reflux disease without esophagitis: Secondary | ICD-10-CM | POA: Diagnosis not present

## 2022-12-11 DIAGNOSIS — E785 Hyperlipidemia, unspecified: Secondary | ICD-10-CM | POA: Diagnosis not present

## 2022-12-11 MED ORDER — NAFTIFINE HCL 1 % EX CREA
1.0000 | TOPICAL_CREAM | Freq: Two times a day (BID) | CUTANEOUS | 0 refills | Status: DC
  Filled 2022-12-11: qty 90, fill #0
  Filled 2022-12-11 – 2023-03-22 (×3): qty 90, 45d supply, fill #0

## 2022-12-11 MED ORDER — DULOXETINE HCL 30 MG PO CPEP
60.0000 mg | ORAL_CAPSULE | Freq: Every day | ORAL | 3 refills | Status: DC
  Filled 2022-12-11 (×2): qty 180, 90d supply, fill #0
  Filled 2023-03-22: qty 180, 90d supply, fill #1
  Filled 2023-06-11: qty 180, 90d supply, fill #2
  Filled 2023-09-16: qty 180, 90d supply, fill #3

## 2022-12-11 NOTE — Progress Notes (Unsigned)
   Subjective:    Patient ID: Marissa Grant, female    DOB: 1955/09/23, 67 y.o.   MRN: [id number]  DOS:  12/11/2022 Type of visit - description: Follow-up  Since the last office visit, she stopped Protonix which was prescribed for dyspepsia and cough. She did not think it helped much but nevertheless symptoms are overall decreased. Denies nausea vomiting.  No blood in the stools.  No dysphagia or odynophagia.  Has developed a rash on the feet.  Review of Systems See above   Past Medical History:  Diagnosis Date   Acid reflux    Chicken pox as a child   Osteopenia     Past Surgical History:  Procedure Laterality Date   CHOLECYSTECTOMY N/A 05/26/2019   Procedure: LAPAROSCOPIC CHOLECYSTECTOMY;  Surgeon: Axel Filler, MD;  Location: [facility];  Service: General;  Laterality: N/A;   COLONOSCOPY  2011   Dr.Peters    Foot surgery - Cotton, Gastroc Left 09/01/2012   1st ray elevatus, Equinus Lt   TUBAL LIGATION     Hysterectomy documented previously- Patient denies hysterectomy, claims she still has uterus and ovaries 03/24/2019. These were also seen on CT scan in 2013    Current Outpatient Medications  Medication Instructions   cholecalciferol (VITAMIN D3) 1,000 Units, Oral, Daily   DULoxetine (CYMBALTA) 60 mg, Oral, Daily   pantoprazole (PROTONIX) 40 mg, Oral, Daily before breakfast       Objective:   Physical Exam BP 138/80   Pulse 86   Temp 98.6 F (37 C) (Oral)   Resp 16   Ht 4\' 10"  (1.473 m)   Wt 138 lb 8 oz (62.8 kg)   SpO2 99%   BMI 28.95 kg/m  General:   Well developed, NAD, BMI noted. HEENT:  Normocephalic . Face symmetric, atraumatic Lower extremities: no pretibial edema bilaterally  Skin:  Mild dryness and scaliness at the borders of the feet bilaterally.  No maceration between the toes Neurologic:  alert & oriented X3.  Speech normal, gait appropriate for age and unassisted Psych--  Cognition and judgment appear intact.  Cooperative with normal  attention span and concentration.  Behavior appropriate. No anxious or depressed appearing.      Assessment     ASSESSMENT  DM (A1c 6.1 102,024) Dyslipidemia GERD-dyspepsia, chronic: saw GI , EGD (-) 2014 Neuropathy: 2016 labs (-), rx gabapentin (mild response but intolerant); saw neuro 2017;  NCS 10-2015 (-); ABIs wnl 12-2015; saw neuro surgery 12/28, 11/2017 MRI: mild DJD. neurology [facility] 06/20/2021: labs, XR , MRI C-spine, T spine Osteopenia:   Tscore -1.8 (03-2014) , T score -1.9 (09-2018) on ca and vit d  PLAN: DM: Last A1c 6.5.  Explained that this is diabetes, we had a conversation about the need to decrease carbohydrates, eat more fruits and vegetables and exercise more.  She actually has improve her lifestyle in the last few months.  Recheck A1c Dyslipidemia: Explained that every patient with diabetes needs to be on statins, goal is to reduce risk of heart attack of the strokes.  Declined statins for now. Cough: Slightly better.  See next GERD-dyspepsia: Chronic issue, self d/c pantoprazole as rx during LOV, did not see any no major improvement however today she said the symptoms are "a little better". Rash: Likely fungal infection at the feet, RF Naftin. Neuropathy: RF Cymbalta RTC 4 months

## 2022-12-11 NOTE — Patient Instructions (Addendum)
Fungal infection, feet: Apply the cream twice daily for 2 weeks.   vaccines I recommend:  Covid booster     GO TO THE LAB : Get the blood work     GO TO THE FRONT DESK, PLEASE SCHEDULE YOUR APPOINTMENTS Come back for checkup in 4 to 5 months

## 2022-12-12 DIAGNOSIS — E785 Hyperlipidemia, unspecified: Secondary | ICD-10-CM | POA: Insufficient documentation

## 2022-12-12 NOTE — Assessment & Plan Note (Signed)
DM: Last A1c 6.5.  Explained that this is diabetes, we had a conversation about the need to decrease carbohydrates, eat more fruits and vegetables and exercise more.  She actually has improve her lifestyle in the last few months.  Recheck A1c Dyslipidemia: Explained that every patient with diabetes needs to be on statins, goal is to reduce risk of heart attack of the strokes.  Declined statins for now. Cough: Slightly better.  See next GERD-dyspepsia: Chronic issue, self d/c pantoprazole as rx during LOV, did not see any no major improvement however today she said the symptoms are "a little better". Rash: Likely fungal infection at the feet, RF Naftin. Neuropathy: RF Cymbalta RTC 4 months

## 2022-12-18 ENCOUNTER — Encounter: Payer: Self-pay | Admitting: Internal Medicine

## 2022-12-21 ENCOUNTER — Other Ambulatory Visit (HOSPITAL_BASED_OUTPATIENT_CLINIC_OR_DEPARTMENT_OTHER): Payer: Self-pay

## 2022-12-21 ENCOUNTER — Encounter (HOSPITAL_BASED_OUTPATIENT_CLINIC_OR_DEPARTMENT_OTHER): Payer: Self-pay

## 2023-01-04 ENCOUNTER — Ambulatory Visit: Payer: Commercial Managed Care - PPO | Admitting: Internal Medicine

## 2023-01-04 ENCOUNTER — Other Ambulatory Visit (HOSPITAL_BASED_OUTPATIENT_CLINIC_OR_DEPARTMENT_OTHER): Payer: Self-pay

## 2023-03-22 ENCOUNTER — Other Ambulatory Visit (HOSPITAL_BASED_OUTPATIENT_CLINIC_OR_DEPARTMENT_OTHER): Payer: Self-pay

## 2023-03-22 ENCOUNTER — Other Ambulatory Visit: Payer: Self-pay | Admitting: Internal Medicine

## 2023-03-23 ENCOUNTER — Other Ambulatory Visit (HOSPITAL_BASED_OUTPATIENT_CLINIC_OR_DEPARTMENT_OTHER): Payer: Self-pay

## 2023-04-12 ENCOUNTER — Ambulatory Visit: Payer: Medicare HMO | Admitting: Internal Medicine

## 2023-05-20 DIAGNOSIS — K219 Gastro-esophageal reflux disease without esophagitis: Secondary | ICD-10-CM | POA: Diagnosis not present

## 2023-05-20 DIAGNOSIS — G629 Polyneuropathy, unspecified: Secondary | ICD-10-CM | POA: Diagnosis not present

## 2023-05-20 DIAGNOSIS — Z008 Encounter for other general examination: Secondary | ICD-10-CM | POA: Diagnosis not present

## 2023-05-20 DIAGNOSIS — Z5986 Financial insecurity: Secondary | ICD-10-CM | POA: Diagnosis not present

## 2023-05-20 DIAGNOSIS — Z818 Family history of other mental and behavioral disorders: Secondary | ICD-10-CM | POA: Diagnosis not present

## 2023-05-20 DIAGNOSIS — Z820 Family history of epilepsy and other diseases of the nervous system: Secondary | ICD-10-CM | POA: Diagnosis not present

## 2023-06-11 ENCOUNTER — Other Ambulatory Visit (HOSPITAL_BASED_OUTPATIENT_CLINIC_OR_DEPARTMENT_OTHER): Payer: Self-pay

## 2023-06-24 DIAGNOSIS — Z1231 Encounter for screening mammogram for malignant neoplasm of breast: Secondary | ICD-10-CM | POA: Diagnosis not present

## 2023-06-24 LAB — HM MAMMOGRAPHY

## 2023-06-25 ENCOUNTER — Encounter: Payer: Self-pay | Admitting: Internal Medicine

## 2023-06-28 ENCOUNTER — Ambulatory Visit: Payer: Medicare HMO | Admitting: Obstetrics & Gynecology

## 2023-07-07 ENCOUNTER — Other Ambulatory Visit (HOSPITAL_BASED_OUTPATIENT_CLINIC_OR_DEPARTMENT_OTHER): Payer: Self-pay

## 2023-07-07 ENCOUNTER — Other Ambulatory Visit: Payer: Self-pay

## 2023-07-07 DIAGNOSIS — H11052 Peripheral pterygium, progressive, left eye: Secondary | ICD-10-CM | POA: Diagnosis not present

## 2023-07-07 DIAGNOSIS — H524 Presbyopia: Secondary | ICD-10-CM | POA: Diagnosis not present

## 2023-07-07 DIAGNOSIS — H2513 Age-related nuclear cataract, bilateral: Secondary | ICD-10-CM | POA: Diagnosis not present

## 2023-07-07 DIAGNOSIS — Z135 Encounter for screening for eye and ear disorders: Secondary | ICD-10-CM | POA: Diagnosis not present

## 2023-07-07 LAB — HM DIABETES EYE EXAM

## 2023-07-07 MED ORDER — FLUOROMETHOLONE 0.1 % OP SUSP
OPHTHALMIC | 1 refills | Status: DC
  Filled 2023-07-07: qty 10, 30d supply, fill #0

## 2023-07-09 DIAGNOSIS — R922 Inconclusive mammogram: Secondary | ICD-10-CM | POA: Diagnosis not present

## 2023-07-12 ENCOUNTER — Encounter: Payer: Self-pay | Admitting: Internal Medicine

## 2023-07-20 ENCOUNTER — Other Ambulatory Visit (HOSPITAL_BASED_OUTPATIENT_CLINIC_OR_DEPARTMENT_OTHER): Payer: Self-pay

## 2023-07-23 DIAGNOSIS — R922 Inconclusive mammogram: Secondary | ICD-10-CM | POA: Diagnosis not present

## 2023-07-29 ENCOUNTER — Encounter: Payer: Self-pay | Admitting: Internal Medicine

## 2023-08-16 ENCOUNTER — Other Ambulatory Visit (HOSPITAL_COMMUNITY)
Admission: RE | Admit: 2023-08-16 | Discharge: 2023-08-16 | Disposition: A | Discharge status: Home / Self Care | Payer: Medicare HMO | Source: Ambulatory Visit | Attending: Family Medicine | Admitting: Family Medicine

## 2023-08-16 ENCOUNTER — Ambulatory Visit (INDEPENDENT_AMBULATORY_CARE_PROVIDER_SITE_OTHER): Payer: Medicare HMO

## 2023-08-16 VITALS — BP 133/61 | HR 70 | Wt 138.0 lb

## 2023-08-16 DIAGNOSIS — N898 Other specified noninflammatory disorders of vagina: Secondary | ICD-10-CM | POA: Diagnosis not present

## 2023-08-16 NOTE — Progress Notes (Signed)
SUBJECTIVE:  67 y.o. female complains of vaginal itching for seven days. Denies abnormal vaginal bleeding or significant pelvic pain or fever. No UTI symptoms. Denies history of known exposure to STD.  No LMP recorded. Patient is postmenopausal.  OBJECTIVE:  She appears well, afebrile. Urine dipstick: not done.  ASSESSMENT:  Vaginitis   PLAN:  BVAG, CVAG probe sent to lab. Treatment: To be determined once lab results are received ROV prn if symptoms persist or worsen.

## 2023-08-16 NOTE — Progress Notes (Signed)
Chart reviewed - agree with CMA/RN documentation.  ° °

## 2023-08-18 LAB — CERVICOVAGINAL ANCILLARY ONLY
Bacterial Vaginitis (gardnerella): NEGATIVE
Candida Glabrata: NEGATIVE
Candida Vaginitis: NEGATIVE
Comment: NEGATIVE
Comment: NEGATIVE
Comment: NEGATIVE

## 2023-08-30 ENCOUNTER — Other Ambulatory Visit (HOSPITAL_COMMUNITY): Payer: Self-pay

## 2023-08-30 DIAGNOSIS — N760 Acute vaginitis: Secondary | ICD-10-CM | POA: Diagnosis not present

## 2023-08-30 DIAGNOSIS — R350 Frequency of micturition: Secondary | ICD-10-CM | POA: Diagnosis not present

## 2023-08-30 MED ORDER — FLUCONAZOLE 150 MG PO TABS
150.0000 mg | ORAL_TABLET | Freq: Once | ORAL | 0 refills | Status: AC
  Filled 2023-08-30 – 2023-08-31 (×2): qty 1, 1d supply, fill #0

## 2023-08-30 MED ORDER — NITROFURANTOIN MONOHYD MACRO 100 MG PO CAPS
100.0000 mg | ORAL_CAPSULE | Freq: Two times a day (BID) | ORAL | 0 refills | Status: DC
  Filled 2023-08-30 – 2023-08-31 (×2): qty 14, 7d supply, fill #0

## 2023-08-30 MED ORDER — CLOTRIMAZOLE 1 % EX CREA
1.0000 | TOPICAL_CREAM | Freq: Two times a day (BID) | CUTANEOUS | 0 refills | Status: DC
  Filled 2023-08-31: qty 14, 7d supply, fill #0

## 2023-08-31 ENCOUNTER — Other Ambulatory Visit (HOSPITAL_BASED_OUTPATIENT_CLINIC_OR_DEPARTMENT_OTHER): Payer: Self-pay

## 2023-08-31 ENCOUNTER — Other Ambulatory Visit (HOSPITAL_COMMUNITY): Payer: Self-pay

## 2023-09-16 ENCOUNTER — Other Ambulatory Visit (HOSPITAL_BASED_OUTPATIENT_CLINIC_OR_DEPARTMENT_OTHER): Payer: Self-pay

## 2023-10-07 ENCOUNTER — Other Ambulatory Visit (HOSPITAL_COMMUNITY): Payer: Self-pay

## 2023-10-07 ENCOUNTER — Telehealth: Payer: Self-pay | Admitting: Internal Medicine

## 2023-10-07 DIAGNOSIS — L292 Pruritus vulvae: Secondary | ICD-10-CM | POA: Diagnosis not present

## 2023-10-07 DIAGNOSIS — N898 Other specified noninflammatory disorders of vagina: Secondary | ICD-10-CM | POA: Diagnosis not present

## 2023-10-07 DIAGNOSIS — Z124 Encounter for screening for malignant neoplasm of cervix: Secondary | ICD-10-CM | POA: Diagnosis not present

## 2023-10-07 DIAGNOSIS — Z8742 Personal history of other diseases of the female genital tract: Secondary | ICD-10-CM | POA: Diagnosis not present

## 2023-10-07 DIAGNOSIS — N9089 Other specified noninflammatory disorders of vulva and perineum: Secondary | ICD-10-CM | POA: Diagnosis not present

## 2023-10-07 LAB — HM PAP SMEAR

## 2023-10-07 LAB — RESULTS CONSOLE HPV: CHL HPV: POSITIVE

## 2023-10-07 MED ORDER — LIDOCAINE-PRILOCAINE 2.5-2.5 % EX CREA
1.0000 | TOPICAL_CREAM | CUTANEOUS | 0 refills | Status: DC
  Filled 2023-10-07 – 2023-10-08 (×2): qty 30, 30d supply, fill #0

## 2023-10-07 NOTE — Telephone Encounter (Signed)
Copied from CRM [id number]. Topic: Medicare AWV >> Oct 07, 2023  9:07 AM Payton Doughty wrote: Reason for CRM: Called 10/07/2023 to sched AWV - NO VOICEMAIL  Verlee Rossetti; Care Guide Ambulatory Clinical Support [place] [facility] Direct Dial: [phone]

## 2023-10-08 ENCOUNTER — Encounter: Payer: Self-pay | Admitting: Internal Medicine

## 2023-10-08 ENCOUNTER — Other Ambulatory Visit (HOSPITAL_BASED_OUTPATIENT_CLINIC_OR_DEPARTMENT_OTHER): Payer: Self-pay

## 2023-10-08 ENCOUNTER — Other Ambulatory Visit (HOSPITAL_COMMUNITY): Payer: Self-pay

## 2023-10-08 MED ORDER — HYDROXYZINE PAMOATE 25 MG PO CAPS
25.0000 mg | ORAL_CAPSULE | Freq: Every day | ORAL | 0 refills | Status: DC
  Filled 2023-10-08 (×2): qty 30, 15d supply, fill #0

## 2023-10-11 ENCOUNTER — Encounter: Payer: Self-pay | Admitting: Internal Medicine

## 2023-10-11 ENCOUNTER — Other Ambulatory Visit (HOSPITAL_BASED_OUTPATIENT_CLINIC_OR_DEPARTMENT_OTHER): Payer: Self-pay

## 2023-10-11 ENCOUNTER — Ambulatory Visit (INDEPENDENT_AMBULATORY_CARE_PROVIDER_SITE_OTHER): Payer: Medicare HMO | Admitting: Internal Medicine

## 2023-10-11 VITALS — BP 128/80 | HR 67 | Temp 98.1°F | Resp 16 | Ht <= 58 in | Wt 142.1 lb

## 2023-10-11 DIAGNOSIS — Z0000 Encounter for general adult medical examination without abnormal findings: Secondary | ICD-10-CM | POA: Diagnosis not present

## 2023-10-11 DIAGNOSIS — Z780 Asymptomatic menopausal state: Secondary | ICD-10-CM | POA: Diagnosis not present

## 2023-10-11 DIAGNOSIS — E119 Type 2 diabetes mellitus without complications: Secondary | ICD-10-CM

## 2023-10-11 DIAGNOSIS — E785 Hyperlipidemia, unspecified: Secondary | ICD-10-CM

## 2023-10-11 NOTE — Patient Instructions (Addendum)
It was good to see you today.  Vaccines I recommend: Tdap (tetanus)   GO TO THE LAB : Get the blood work     Next visit with me in 4 months for a checkup Please schedule it at the front desk    STOP BY THE FIRST FLOOR: Range for a bone density test   "Health Care Power of attorney" ,  "Living will" (Advance care planning documents)  If you already have a living will or healthcare power of attorney, is recommended you bring the copy to be scanned in your chart.   The document will be available to all the doctors you see in the system.  Advance care planning is a process that supports adults in  understanding and sharing their preferences regarding future medical care.  The patient's preferences are recorded in documents called Advance Directives and the can be modified at any time while the patient is in full mental capacity.   If you don't have one, please consider create one.      More information at: [email or web address]

## 2023-10-11 NOTE — Assessment & Plan Note (Signed)
Here for CPX - Td 2015 - s/p  Shingrix, PNM 20: 2024.  S/p Shingrix x 2 - Had a flu shot. Had a covid vax 06-2023 per pt  - Recommend Tdap -CCS: Colonoscopy 2011 at [facility], told normal, C-scope again 12-2019, next 2028 per GI letter, Dr Christella Hartigan -Female care-  Sees gynecology.  MMG  07/2023 (K PN) -Diet and exercise: Doing well, exercising regularly, trying  to eat healthier. -Labs: CMP FLP CBC A1c TSH micro -Healthcare POA: See AVS

## 2023-10-11 NOTE — Progress Notes (Signed)
Subjective:    Patient ID: Marissa Grant, female    DOB: 1956/07/06, 68 y.o.   MRN: [id number]  DOS:  10/11/2023 Type of visit - description: CPX  Here for CPX.  Doing well.  Neuropathy symptoms ongoing, at baseline.   Review of Systems  A 14 point review of systems is negative    Past Medical History:  Diagnosis Date   Acid reflux    Chicken pox as a child   Osteopenia     Past Surgical History:  Procedure Laterality Date   CHOLECYSTECTOMY N/A 05/26/2019   Procedure: LAPAROSCOPIC CHOLECYSTECTOMY;  Surgeon: Axel Filler, MD;  Location: [facility];  Service: General;  Laterality: N/A;   COLONOSCOPY  2011   Dr.Peters    Foot surgery - Cotton, Gastroc Left 09/01/2012   1st ray elevatus, Equinus Lt   TUBAL LIGATION     Hysterectomy documented previously- Patient denies hysterectomy, claims she still has uterus and ovaries 03/24/2019. 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: [employer]  Tobacco Use   Smoking status: Never   Smokeless tobacco: Never  Vaping Use   Vaping status: Never Used  Substance and Sexual Activity   Alcohol use: Yes    Alcohol/week: 0.0 standard drinks of alcohol    Comment: rare wine   Drug use: No   Sexual activity: Not Currently    Birth control/protection: None  Other Topics Concern   Not on file  Social History Narrative   Original from [place] in the [place]   Household pt and daughter    2 children   Retired    Chief Executive Officer [employer] investment banker Strain: Not on [employer] Insecurity: Not on file  Transportation Needs: Not on file  Physical Activity: Not on file  Stress: Not on file  Social Connections: Not on file  Intimate Partner Violence: Not  on file    Current Outpatient Medications  Medication Instructions   cholecalciferol (VITAMIN D3) 1,000 Units, Daily   clotrimazole (LOTRIMIN) 1 % cream 1 Application, Topical, 2 times daily   DULoxetine (CYMBALTA) 60 mg, Oral, Daily   hydrOXYzine (VISTARIL) 25 MG capsule Take 1-2 capsules (25-50 mg total) by mouth at bedtime for itching.   lidocaine-prilocaine (EMLA) cream Apply to affected area or area of biopsy 45 minutes before biopy.   MAGNESIUM PO 2 capsules, Oral, Daily       Objective:   Physical Exam BP 128/80   Pulse 67   Temp 98.1 F (36.7 C) (Oral)   Resp 16   Ht 4\' 10"  (1.473 m)   Wt 142 lb 2 oz (64.5 kg)   SpO2 97%   BMI 29.70 kg/m  General: Well developed, NAD, BMI noted Neck: No  thyromegaly  HEENT:  Normocephalic . Face symmetric, atraumatic Lungs:  CTA B Normal respiratory effort, no intercostal retractions, no accessory muscle use. Heart: RRR,  no murmur.  Abdomen:  Not distended, soft, non-tender. No rebound or rigidity.   Lower extremities: no pretibial edema bilaterally  Skin: Exposed areas without rash. Not pale. Not jaundice Neurologic:  alert & oriented X3.  Speech normal, gait appropriate for  age and unassisted Strength symmetric and appropriate for age.  Psych: Cognition and judgment appear intact.  Cooperative with normal attention span and concentration.  Behavior appropriate. No anxious or depressed appearing.     Assessment    ASSESSMENT  DM (A1c 6.1 102,024) Dyslipidemia GERD-dyspepsia, chronic: saw GI , EGD (-) 2014 Neuropathy: 2016 labs (-), rx gabapentin (mild response but intolerant); saw neuro 2017;  NCS 10-2015 (-); ABIs wnl 12-2015; saw neuro surgery 12/28, 11/2017 MRI: mild DJD. neurology [facility] 06/20/2021: labs, XR , MRI C-spine, T spine Osteopenia:   Tscore -1.8 (03-2014) , T score -1.9 (09-2018) on ca and vit d  PLAN: Here for CPX - Td 2015 - s/p  Shingrix, PNM 20: 2024.  S/p Shingrix x 2 - Had a flu shot. Had a covid  vax 06-2023 per pt  - Recommend Tdap -CCS: Colonoscopy 2011 at [facility], told normal, C-scope again 12-2019, next 2028 per GI letter, Dr Christella Hartigan -Female care-  Sees gynecology.  MMG  07/2023 (K PN) -Diet and exercise: Doing well, exercising regularly, trying  to eat healthier. -Labs: CMP FLP CBC A1c TSH micro -Healthcare POA: See AVS Other issues addressed today:  DM: Has improved her lifestyle, increase exercise, some weight gain noted.  Check labs. Dyslipidemia: Diet controlled, check labs GERD, dyspepsia: Not a major issue at this point. Neuropathy, chronic symptoms, on Cymbalta Osteopenia: Last T-score -1.9 (2020), she is active, vitamin D, recheck DEXA. Social: Retired, remaining physically active and socially engaged. RTC 4 months   ==== DM: Last A1c 6.5.  Explained that this is diabetes, we had a conversation about the need to decrease carbohydrates, eat more fruits and vegetables and exercise more.  She actually has improve her lifestyle in the last few months.  Recheck A1c Dyslipidemia: Explained that every patient with diabetes needs to be on statins, goal is to reduce risk of heart attack of the strokes.  Declined statins for now. Cough: Slightly better.  See next GERD-dyspepsia: Chronic issue, self d/c pantoprazole as rx during LOV, did not see any no major improvement however today she said the symptoms are "a little better". Rash: Likely fungal infection at the feet, RF Naftin. Neuropathy: RF Cymbalta RTC 4 months

## 2023-10-11 NOTE — Assessment & Plan Note (Signed)
Here for CPX Other issues addressed today:  DM: Has improved her lifestyle, increase exercise, some weight gain noted.  Check labs. Dyslipidemia: Diet controlled, check labs GERD, dyspepsia: Not a major issue at this point. Neuropathy, chronic symptoms, on Cymbalta Osteopenia: Last T-score -1.9 (2020), she is active, vitamin D, recheck DEXA. Social: Retired, remaining physically active and socially engaged. RTC 4 months

## 2023-10-12 LAB — LIPID PANEL
Cholesterol: 151 mg/dL (ref 0–200)
HDL: 51.6 mg/dL (ref >39.00)
LDL Cholesterol: 67 mg/dL (ref 0–99)
NonHDL: 99.05
Total CHOL/HDL Ratio: 3
Triglycerides: 160 mg/dL — ABNORMAL HIGH (ref 0.0–149.0)
VLDL: 32 mg/dL (ref 0.0–40.0)

## 2023-10-12 LAB — CBC WITH DIFFERENTIAL/PLATELET
Basophils Absolute: 0.1 10*3/uL (ref 0.0–0.1)
Basophils Relative: 1.5 % (ref 0.0–3.0)
Eosinophils Absolute: 0.1 10*3/uL (ref 0.0–0.7)
Eosinophils Relative: 0.7 % (ref 0.0–5.0)
HCT: 41.4 % (ref 36.0–46.0)
Hemoglobin: 13.6 g/dL (ref 12.0–15.0)
Lymphocytes Relative: 27.4 % (ref 12.0–46.0)
Lymphs Abs: 2 10*3/uL (ref 0.7–4.0)
MCHC: 32.8 g/dL (ref 30.0–36.0)
MCV: 91.6 fL (ref 78.0–100.0)
Monocytes Absolute: 0.4 10*3/uL (ref 0.1–1.0)
Monocytes Relative: 5.7 % (ref 3.0–12.0)
Neutro Abs: 4.8 10*3/uL (ref 1.4–7.7)
Neutrophils Relative %: 64.7 % (ref 43.0–77.0)
Platelets: 268 10*3/uL (ref 150.0–400.0)
RBC: 4.52 Mil/uL (ref 3.87–5.11)
RDW: 13.6 % (ref 11.5–15.5)
WBC: 7.4 10*3/uL (ref 4.0–10.5)

## 2023-10-12 LAB — COMPREHENSIVE METABOLIC PANEL
ALT: 21 U/L (ref 0–35)
AST: 21 U/L (ref 0–37)
Albumin: 4.2 g/dL (ref 3.5–5.2)
Alkaline Phosphatase: 66 U/L (ref 39–117)
BUN: 20 mg/dL (ref 6–23)
CO2: 28 meq/L (ref 19–32)
Calcium: 9.4 mg/dL (ref 8.4–10.5)
Chloride: 104 meq/L (ref 96–112)
Creatinine, Ser: 0.83 mg/dL (ref 0.40–1.20)
GFR: 73.63 mL/min (ref >60.00)
Glucose, Bld: 114 mg/dL — ABNORMAL HIGH (ref 70–99)
Potassium: 3.7 meq/L (ref 3.5–5.1)
Sodium: 141 meq/L (ref 135–145)
Total Bilirubin: 0.7 mg/dL (ref 0.2–1.2)
Total Protein: 6.8 g/dL (ref 6.0–8.3)

## 2023-10-12 LAB — MICROALBUMIN / CREATININE URINE RATIO
Creatinine,U: 45.4 mg/dL
Microalb Creat Ratio: 1.5 mg/g (ref 0.0–30.0)
Microalb, Ur: <0.7 mg/dL (ref 0.0–1.9)

## 2023-10-12 LAB — HEMOGLOBIN A1C: Hgb A1c MFr Bld: 6 % (ref 4.6–6.5)

## 2023-10-12 LAB — TSH: TSH: 1.65 u[IU]/mL (ref 0.35–5.50)

## 2023-10-13 ENCOUNTER — Encounter: Payer: Self-pay | Admitting: Internal Medicine

## 2023-10-18 ENCOUNTER — Ambulatory Visit (HOSPITAL_BASED_OUTPATIENT_CLINIC_OR_DEPARTMENT_OTHER)
Admission: RE | Admit: 2023-10-18 | Discharge: 2023-10-18 | Disposition: A | Discharge status: Home / Self Care | Payer: Medicare HMO | Source: Ambulatory Visit | Attending: Internal Medicine | Admitting: Internal Medicine

## 2023-10-18 DIAGNOSIS — Z780 Asymptomatic menopausal state: Secondary | ICD-10-CM | POA: Diagnosis not present

## 2023-10-20 ENCOUNTER — Encounter: Payer: Self-pay | Admitting: Internal Medicine

## 2023-10-20 DIAGNOSIS — K5900 Constipation, unspecified: Secondary | ICD-10-CM | POA: Diagnosis not present

## 2023-10-20 DIAGNOSIS — K219 Gastro-esophageal reflux disease without esophagitis: Secondary | ICD-10-CM | POA: Diagnosis not present

## 2023-10-20 DIAGNOSIS — E1165 Type 2 diabetes mellitus with hyperglycemia: Secondary | ICD-10-CM | POA: Diagnosis not present

## 2023-10-20 DIAGNOSIS — E669 Obesity, unspecified: Secondary | ICD-10-CM | POA: Diagnosis not present

## 2023-10-20 DIAGNOSIS — R030 Elevated blood-pressure reading, without diagnosis of hypertension: Secondary | ICD-10-CM | POA: Diagnosis not present

## 2023-10-20 DIAGNOSIS — E785 Hyperlipidemia, unspecified: Secondary | ICD-10-CM | POA: Diagnosis not present

## 2023-10-20 DIAGNOSIS — Z008 Encounter for other general examination: Secondary | ICD-10-CM | POA: Diagnosis not present

## 2023-10-20 DIAGNOSIS — E1136 Type 2 diabetes mellitus with diabetic cataract: Secondary | ICD-10-CM | POA: Diagnosis not present

## 2023-10-20 DIAGNOSIS — F325 Major depressive disorder, single episode, in full remission: Secondary | ICD-10-CM | POA: Diagnosis not present

## 2023-10-20 DIAGNOSIS — R32 Unspecified urinary incontinence: Secondary | ICD-10-CM | POA: Diagnosis not present

## 2023-10-20 DIAGNOSIS — E11620 Type 2 diabetes mellitus with diabetic dermatitis: Secondary | ICD-10-CM | POA: Diagnosis not present

## 2023-10-20 DIAGNOSIS — E1142 Type 2 diabetes mellitus with diabetic polyneuropathy: Secondary | ICD-10-CM | POA: Diagnosis not present

## 2023-10-20 DIAGNOSIS — Z6832 Body mass index (BMI) 32.0-32.9, adult: Secondary | ICD-10-CM | POA: Diagnosis not present

## 2023-10-21 ENCOUNTER — Other Ambulatory Visit (HOSPITAL_COMMUNITY): Payer: Self-pay

## 2023-10-21 MED ORDER — ALENDRONATE SODIUM 70 MG PO TABS
70.0000 mg | ORAL_TABLET | ORAL | 3 refills | Status: AC
  Filled 2023-10-21 – 2023-10-22 (×2): qty 12, 84d supply, fill #0
  Filled 2024-01-28: qty 12, 84d supply, fill #1
  Filled 2024-05-05 – 2024-05-11 (×3): qty 12, 84d supply, fill #2
  Filled 2024-05-11: qty 12, 84d supply, fill #0
  Filled 2024-08-02: qty 12, 84d supply, fill #1

## 2023-10-21 NOTE — Addendum Note (Signed)
Addended byConrad Hubbard D on: 10/21/2023 02:10 PM   Modules accepted: Orders

## 2023-10-21 NOTE — Telephone Encounter (Signed)
Please call patient, she agreed to start Fosamax 70 mg weekly. Send a 1 year supply. Explain her this is a once a week only medication, advised her about how to take it and precautions.

## 2023-10-21 NOTE — Telephone Encounter (Signed)
Spoke w/ Pt- informed of precautions. Pt verbalized understanding.

## 2023-10-22 ENCOUNTER — Other Ambulatory Visit (HOSPITAL_COMMUNITY): Payer: Self-pay

## 2023-10-22 ENCOUNTER — Other Ambulatory Visit (HOSPITAL_BASED_OUTPATIENT_CLINIC_OR_DEPARTMENT_OTHER): Payer: Self-pay

## 2023-11-02 ENCOUNTER — Other Ambulatory Visit (HOSPITAL_COMMUNITY): Payer: Self-pay

## 2023-11-02 DIAGNOSIS — L900 Lichen sclerosus et atrophicus: Secondary | ICD-10-CM | POA: Diagnosis not present

## 2023-11-02 DIAGNOSIS — N9089 Other specified noninflammatory disorders of vulva and perineum: Secondary | ICD-10-CM | POA: Diagnosis not present

## 2023-11-02 MED ORDER — HYDROXYZINE PAMOATE 25 MG PO CAPS
25.0000 mg | ORAL_CAPSULE | Freq: Every day | ORAL | 1 refills | Status: DC
Start: 2023-11-02 — End: 2023-11-30
  Filled 2023-11-02 – 2023-11-03 (×2): qty 30, 15d supply, fill #0

## 2023-11-03 ENCOUNTER — Other Ambulatory Visit (HOSPITAL_BASED_OUTPATIENT_CLINIC_OR_DEPARTMENT_OTHER): Payer: Self-pay

## 2023-11-03 ENCOUNTER — Other Ambulatory Visit (HOSPITAL_COMMUNITY): Payer: Self-pay

## 2023-11-11 ENCOUNTER — Other Ambulatory Visit (HOSPITAL_BASED_OUTPATIENT_CLINIC_OR_DEPARTMENT_OTHER): Payer: Self-pay

## 2023-11-12 ENCOUNTER — Other Ambulatory Visit (HOSPITAL_COMMUNITY): Payer: Self-pay

## 2023-11-12 MED ORDER — CLOBETASOL PROPIONATE 0.05 % EX OINT
1.0000 | TOPICAL_OINTMENT | Freq: Two times a day (BID) | CUTANEOUS | 0 refills | Status: DC
Start: 2023-11-12 — End: 2023-11-30
  Filled 2023-11-12: qty 30, 15d supply, fill #0

## 2023-11-24 ENCOUNTER — Other Ambulatory Visit (HOSPITAL_COMMUNITY): Payer: Self-pay

## 2023-11-30 ENCOUNTER — Other Ambulatory Visit (HOSPITAL_BASED_OUTPATIENT_CLINIC_OR_DEPARTMENT_OTHER): Payer: Self-pay

## 2023-11-30 DIAGNOSIS — N870 Mild cervical dysplasia: Secondary | ICD-10-CM | POA: Diagnosis not present

## 2023-11-30 DIAGNOSIS — R87810 Cervical high risk human papillomavirus (HPV) DNA test positive: Secondary | ICD-10-CM | POA: Diagnosis not present

## 2023-11-30 DIAGNOSIS — L900 Lichen sclerosus et atrophicus: Secondary | ICD-10-CM | POA: Diagnosis not present

## 2023-11-30 DIAGNOSIS — N72 Inflammatory disease of cervix uteri: Secondary | ICD-10-CM | POA: Diagnosis not present

## 2023-11-30 DIAGNOSIS — Z8742 Personal history of other diseases of the female genital tract: Secondary | ICD-10-CM | POA: Diagnosis not present

## 2023-11-30 MED ORDER — CLOBETASOL PROPIONATE 0.05 % EX OINT
1.0000 | TOPICAL_OINTMENT | Freq: Two times a day (BID) | CUTANEOUS | 0 refills | Status: DC
  Filled 2023-11-30: qty 30, 15d supply, fill #0

## 2023-12-01 ENCOUNTER — Other Ambulatory Visit (HOSPITAL_BASED_OUTPATIENT_CLINIC_OR_DEPARTMENT_OTHER): Payer: Self-pay

## 2023-12-10 ENCOUNTER — Other Ambulatory Visit (HOSPITAL_BASED_OUTPATIENT_CLINIC_OR_DEPARTMENT_OTHER): Payer: Self-pay

## 2023-12-10 ENCOUNTER — Other Ambulatory Visit: Payer: Self-pay | Admitting: Internal Medicine

## 2023-12-10 MED ORDER — DULOXETINE HCL 30 MG PO CPEP
60.0000 mg | ORAL_CAPSULE | Freq: Every day | ORAL | 1 refills | Status: DC
  Filled 2023-12-10: qty 180, 90d supply, fill #0

## 2023-12-22 DIAGNOSIS — R87810 Cervical high risk human papillomavirus (HPV) DNA test positive: Secondary | ICD-10-CM | POA: Insufficient documentation

## 2024-01-28 ENCOUNTER — Other Ambulatory Visit (HOSPITAL_BASED_OUTPATIENT_CLINIC_OR_DEPARTMENT_OTHER): Payer: Self-pay

## 2024-02-07 ENCOUNTER — Encounter: Payer: Self-pay | Admitting: Internal Medicine

## 2024-02-08 ENCOUNTER — Ambulatory Visit (INDEPENDENT_AMBULATORY_CARE_PROVIDER_SITE_OTHER): Payer: Medicare HMO | Admitting: Internal Medicine

## 2024-02-08 ENCOUNTER — Encounter: Payer: Self-pay | Admitting: Internal Medicine

## 2024-02-08 ENCOUNTER — Other Ambulatory Visit (HOSPITAL_BASED_OUTPATIENT_CLINIC_OR_DEPARTMENT_OTHER): Payer: Self-pay

## 2024-02-08 VITALS — BP 126/72 | HR 78 | Temp 97.9°F | Resp 16 | Ht <= 58 in | Wt 146.1 lb

## 2024-02-08 DIAGNOSIS — E119 Type 2 diabetes mellitus without complications: Secondary | ICD-10-CM | POA: Diagnosis not present

## 2024-02-08 DIAGNOSIS — M7712 Lateral epicondylitis, left elbow: Secondary | ICD-10-CM

## 2024-02-08 DIAGNOSIS — G629 Polyneuropathy, unspecified: Secondary | ICD-10-CM

## 2024-02-08 LAB — HEMOGLOBIN A1C: Hgb A1c MFr Bld: 5.9 % (ref 4.6–6.5)

## 2024-02-08 MED ORDER — DULOXETINE HCL 30 MG PO CPEP
60.0000 mg | ORAL_CAPSULE | Freq: Every day | ORAL | 3 refills | Status: AC
  Filled 2024-02-08 – 2024-03-06 (×2): qty 180, 90d supply, fill #0
  Filled 2024-07-11: qty 180, 90d supply, fill #1
  Filled 2024-10-05: qty 180, 90d supply, fill #2

## 2024-02-08 NOTE — Patient Instructions (Signed)
 Vaccines I recommend: Flu shot every fall Tdap (tetanus shot) at your pharmacy.  Left elbow pain is likely due to "tennis elbow".  See the information below  Get a tennis elbow brace from the store and use it every day for about 2 weeks  Okay to apply over-the-counter Voltaren gel 3-4 times week.  If you are not gradually better please let us  know.     GO TO THE LAB :  Get the blood work   Your results will be posted on MyChart with my comments  Next office visit for a a physical exam by 10-2024 Please make an appointment before you leave today       Tennis Elbow: What to Know  Tennis elbow is swelling (inflammation) of the tendons in your outer forearm, near your elbow. Tendons connect muscle to bone. Tennis elbow can affect anyone who plays a sport or does a job where they have to do the same motion over and over. What are the causes? Tennis elbow is often caused by: Repeatedly moving your wrist, forearm, or hands. Playing sports or doing work where you need to keep moving your forearm the same way. Sudden injury. What increases the risk? You're more likely to get tennis elbow if you play tennis or another racket sport. You also have a higher risk if you often use your hands for work. This includes: People who use computers. Holiday representative workers. Factory workers. Musicians. Cooks. Cashiers. What are the signs or symptoms? Pain and tenderness along the outside of your forearm and elbow. It might hurt all the time or only when you use your arm. A burning feeling. This starts in your elbow and spreads down your arm. A weak grip in your hand. How is this treated? Resting and icing your arm. Medicines, such as ibuprofen. Steroid shots. An elbow brace or wrist splint. Physical therapy. This may include massage or exercises or both. An elbow brace or wrist strap. Surgery. This is rare. Follow these instructions at home: If you have a brace or strap: Wear the brace or  strap as told by your doctor. Take it off only if your doctor says you can. Check the skin around it every day. Tell your doctor if you see problems. Loosen the brace or strap if your fingers tingle, are numb, or turn cold and blue. Keep the brace or strap clean. If the brace or strap isn't waterproof: Do not let it get wet. Cover it when you take a bath or a shower. Use a cover that doesn't let water in. Managing pain, stiffness, and swelling  Use ice or an ice pack as told. If you have a brace or strap that you can take off, remove it only as told. Place a towel between your skin and the ice. Leave the ice on for 20 minutes, 2-3 times a day. If your skin turns red, take off the ice right away to prevent skin damage. The risk of damage is higher if you can't feel pain, heat, or cold. Move your fingers often to reduce stiffness and swelling. Raise your arm above the level of your heart while you're sitting or lying down. Use pillows as needed. Activity Rest your elbow and wrist. Avoid activities that can cause elbow problems. Do exercises as told by your doctor. Lift objects with the palm of your hand facing up. Lifestyle If your tennis elbow is caused by sports, check your equipment. Make sure that: You're using it the right way. It fits  you well. If your tennis elbow is caused by work or computer use, make sure to: Take breaks often to stretch your arm. Talk with your manager about how you can make your condition better at work. General instructions Take your medicine as told. Do not smoke, vape, or use nicotine or tobacco. Doing this can slow down healing. How is this prevented? Warm up and stretch before being active. Cool down and stretch after being active. Give your body time to rest between activities. Use equipment that fits you. If you play tennis, put power in your stroke with your lower body. Avoid using your arm only. Stay fit. Keep your body strong and flexible. Do  exercises to strengthen the forearm muscles. Contact a doctor if: Your pain gets worse. Your pain doesn't get better with treatment. You have weakness in your forearm, hand, or fingers. You lose feeling (have numbness) in your forearm, hand, or fingers. Get help right away if: Your pain is very bad. You can't move your wrist. This information is not intended to replace advice given to you by your health care provider. Make sure you discuss any questions you have with your health care provider. Document Revised: 04/19/2023 Document Reviewed: 04/19/2023 Elsevier Patient Education  2024 [employer].

## 2024-02-08 NOTE — Progress Notes (Unsigned)
 Subjective:    Patient ID: Marissa Grant, female    DOB: 09-29-1955, 68 y.o.   MRN: [id number]  DOS:  02/08/2024 Type of visit - description: Follow-up  We reviewed chronic medical problems. For 2 weeks he is suffering from left elbow pain. Denies any injury, redness or swelling. His work with certain movements of the hand increase. No suprasternal chest pain or difficulty breathing.  Review of Systems See above   Past Medical History:  Diagnosis Date   Acid reflux    Chicken pox as a child   Osteopenia     Past Surgical History:  Procedure Laterality Date   CHOLECYSTECTOMY N/A 05/26/2019   Procedure: LAPAROSCOPIC CHOLECYSTECTOMY;  Surgeon: Shela Derby, MD;  Location: [facility];  Service: General;  Laterality: N/A;   COLONOSCOPY  2011   Dr.Peters    Foot surgery - Cotton, Gastroc Left 09/01/2012   1st ray elevatus, Equinus Lt   TUBAL LIGATION     Hysterectomy documented previously- Patient denies hysterectomy, claims she still has uterus and ovaries 03/24/2019. These were also seen on CT scan in 2013    Current Outpatient Medications  Medication Instructions   alendronate  (FOSAMAX ) 70 mg, Oral, Every 7 days, Take with a full glass of water on an empty stomach.   cholecalciferol (VITAMIN D3) 1,000 Units, Daily   clobetasol  ointment (TEMOVATE ) 0.05 % Apply pea-sized amount to affected area topically 2 (two) times daily for 6 weeks. Return to office at that time for follow-up.   DULoxetine  (CYMBALTA ) 60 mg, Oral, Daily   lidocaine -prilocaine  (EMLA ) cream Apply to affected area or area of biopsy 45 minutes before biopy.   MAGNESIUM  PO 2 capsules, Daily       Objective:   Physical Exam BP 126/72   Pulse 78   Temp 97.9 F (36.6 C) (Oral)   Resp 16   Ht 4\' 10"  (1.473 m)   Wt 146 lb 2 oz (66.3 kg)   SpO2 97%   BMI 30.54 kg/m  General:   Well developed, NAD, BMI noted. HEENT:  Normocephalic . Face symmetric, atraumatic Lungs:  CTA B Normal respiratory effort, no  intercostal retractions, no accessory muscle use. Heart: RRR,  no murmur. Upper extremity: Slightly TTP at the lateral epicondyle on the left elbow.  No swelling, redness.  ROM elbow is normal DM foot exam: No edema, good pedal pulses, pinprick examination negative.  She has a burning sensation consistent with neuropathy Skin: Not pale. Not jaundice Neurologic:  alert & oriented X3.  Speech normal, gait appropriate for age and unassisted Psych--  Cognition and judgment appear intact.  Cooperative with normal attention span and concentration.  Behavior appropriate. No anxious or depressed appearing.  So AVS is ready    Assessment     ASSESSMENT  DM (A1c 6.1 102,024) Dyslipidemia GERD-dyspepsia, chronic: saw GI , EGD (-) 2014 Neuropathy: 2016 labs (-), rx gabapentin  (mild response but intolerant); saw neuro 2017;  NCS 10-2015 (-); ABIs wnl 12-2015; saw neuro surgery 12/28, 11/2017 MRI: mild DJD. neurology [facility] 06/20/2021: labs, XR , MRI C-spine, T spine Osteopenia:   Tscore -1.8 (03-2014) , T score -1.9 (09-2018) on ca and vit d  PLAN: DM: Diet controlled, last A1c excellent, recheck A1c. Dyslipidemia: Lifestyle controlled. Neuropathy: Symptoms are relatively controlled on Cymbalta , request RF.  Sent. Tennis elbow.  Pain at the left elbow consistent with the diagnosis, explained patient what it is, recommend consistent use of a tennis elbow brace for 2 weeks, topical Voltaren, call  if not gradually better Preventive care: Recommend flu shot every fall and a Tdap. RTC CPX 10/2024 10/2023 Here for CPX - Td 2015 - s/p  Shingrix , PNM 20: 2024.  S/p Shingrix  x 2 - Had a flu shot. Had a covid vax 06-2023 per pt  - Recommend Tdap -CCS: Colonoscopy 2011 at [facility], told normal, C-scope again 12-2019, next 2028 per GI letter, Dr Howard Macho -Female care-  Sees gynecology.  MMG  07/2023 (K PN) -Diet and exercise: Doing well, exercising regularly, trying  to eat healthier. -Labs: CMP FLP CBC A1c TSH  micro -Healthcare POA: See AVS Other issues addressed today:  DM: Has improved her lifestyle, increase exercise, some weight gain noted.  Check labs. Dyslipidemia: Diet controlled, check labs GERD, dyspepsia: Not a major issue at this point. Neuropathy, chronic symptoms, on Cymbalta  Osteopenia: Last T-score -1.9 (2020), she is active, vitamin D , recheck DEXA. Social: Retired, remaining physically active and socially engaged. RTC 4 months

## 2024-02-09 DIAGNOSIS — G629 Polyneuropathy, unspecified: Secondary | ICD-10-CM | POA: Insufficient documentation

## 2024-02-09 NOTE — Assessment & Plan Note (Signed)
 DM: Diet controlled, last A1c excellent, recheck A1c. Dyslipidemia: Lifestyle controlled. Neuropathy: Symptoms are relatively controlled on Cymbalta , request RF.  Sent. Tennis elbow.  Pain at the left elbow consistent with the diagnosis, explained patient what it is, recommend consistent use of a tennis elbow brace for 2 weeks, topical Voltaren, call if not gradually better Preventive care: Recommend flu shot every fall and a Tdap. RTC CPX 10/2024

## 2024-02-10 ENCOUNTER — Ambulatory Visit: Payer: Self-pay | Admitting: Internal Medicine

## 2024-03-06 ENCOUNTER — Other Ambulatory Visit (HOSPITAL_BASED_OUTPATIENT_CLINIC_OR_DEPARTMENT_OTHER): Payer: Self-pay

## 2024-03-29 ENCOUNTER — Telehealth: Payer: Self-pay | Admitting: Internal Medicine

## 2024-03-29 NOTE — Telephone Encounter (Signed)
 Copied from CRM [id number]. Topic: Medicare AWV >> Mar 29, 2024  1:41 PM Nathanel DEL wrote: Reason for CRM: Called LVM 03/29/2024 to schedule AWV. Please schedule Virtual or Telehealth visits ONLY  Nathanel Paschal; Care Guide Ambulatory Clinical Support [place] [facility] Direct Dial: [phone]

## 2024-04-26 ENCOUNTER — Other Ambulatory Visit (HOSPITAL_BASED_OUTPATIENT_CLINIC_OR_DEPARTMENT_OTHER): Payer: Self-pay

## 2024-04-26 ENCOUNTER — Other Ambulatory Visit: Payer: Self-pay

## 2024-04-26 DIAGNOSIS — H2513 Age-related nuclear cataract, bilateral: Secondary | ICD-10-CM | POA: Diagnosis not present

## 2024-04-26 DIAGNOSIS — H11052 Peripheral pterygium, progressive, left eye: Secondary | ICD-10-CM | POA: Diagnosis not present

## 2024-04-26 MED ORDER — GATIFLOXACIN 0.5 % OP SOLN
1.0000 [drp] | Freq: Three times a day (TID) | OPHTHALMIC | 0 refills | Status: DC
  Filled 2024-04-26: qty 2.5, 16d supply, fill #0

## 2024-04-26 MED ORDER — FLUOROMETHOLONE 0.1 % OP SUSP
1.0000 [drp] | Freq: Four times a day (QID) | OPHTHALMIC | 2 refills | Status: DC
  Filled 2024-04-26: qty 5, 13d supply, fill #0

## 2024-05-11 ENCOUNTER — Other Ambulatory Visit (HOSPITAL_COMMUNITY): Payer: Self-pay

## 2024-05-11 ENCOUNTER — Other Ambulatory Visit (HOSPITAL_BASED_OUTPATIENT_CLINIC_OR_DEPARTMENT_OTHER): Payer: Self-pay

## 2024-05-12 ENCOUNTER — Other Ambulatory Visit (HOSPITAL_BASED_OUTPATIENT_CLINIC_OR_DEPARTMENT_OTHER): Payer: Self-pay

## 2024-05-25 ENCOUNTER — Other Ambulatory Visit (HOSPITAL_BASED_OUTPATIENT_CLINIC_OR_DEPARTMENT_OTHER): Payer: Self-pay

## 2024-05-25 DIAGNOSIS — H11052 Peripheral pterygium, progressive, left eye: Secondary | ICD-10-CM | POA: Diagnosis not present

## 2024-05-26 DIAGNOSIS — H18792 Other corneal deformities, left eye: Secondary | ICD-10-CM | POA: Diagnosis not present

## 2024-06-09 ENCOUNTER — Other Ambulatory Visit (HOSPITAL_BASED_OUTPATIENT_CLINIC_OR_DEPARTMENT_OTHER): Payer: Self-pay

## 2024-06-09 MED ORDER — FLUZONE HIGH-DOSE 0.5 ML IM SUSY
0.5000 mL | PREFILLED_SYRINGE | Freq: Once | INTRAMUSCULAR | 0 refills | Status: AC
  Filled 2024-06-09: qty 0.5, 1d supply, fill #0

## 2024-06-29 DIAGNOSIS — Z1231 Encounter for screening mammogram for malignant neoplasm of breast: Secondary | ICD-10-CM | POA: Diagnosis not present

## 2024-06-29 LAB — HM MAMMOGRAPHY

## 2024-07-03 ENCOUNTER — Encounter: Payer: Self-pay | Admitting: Internal Medicine

## 2024-07-11 ENCOUNTER — Other Ambulatory Visit (HOSPITAL_BASED_OUTPATIENT_CLINIC_OR_DEPARTMENT_OTHER): Payer: Self-pay

## 2024-07-18 ENCOUNTER — Encounter: Payer: Self-pay | Admitting: Internal Medicine

## 2024-07-18 DIAGNOSIS — H524 Presbyopia: Secondary | ICD-10-CM | POA: Diagnosis not present

## 2024-07-18 DIAGNOSIS — R928 Other abnormal and inconclusive findings on diagnostic imaging of breast: Secondary | ICD-10-CM | POA: Diagnosis not present

## 2024-07-18 DIAGNOSIS — Z135 Encounter for screening for eye and ear disorders: Secondary | ICD-10-CM | POA: Diagnosis not present

## 2024-07-18 LAB — DIAGNOSTIC MAMMOGRAM

## 2024-07-18 LAB — HM DIABETES EYE EXAM

## 2024-08-02 ENCOUNTER — Other Ambulatory Visit (HOSPITAL_BASED_OUTPATIENT_CLINIC_OR_DEPARTMENT_OTHER): Payer: Self-pay

## 2024-10-13 ENCOUNTER — Encounter: Admitting: Internal Medicine

## 2024-10-13 ENCOUNTER — Encounter: Payer: Self-pay | Admitting: Internal Medicine

## 2024-10-13 VITALS — BP 118/86 | HR 76 | Temp 97.8°F | Resp 16 | Ht <= 58 in | Wt 147.5 lb

## 2024-10-13 DIAGNOSIS — E785 Hyperlipidemia, unspecified: Secondary | ICD-10-CM

## 2024-10-13 DIAGNOSIS — Z0000 Encounter for general adult medical examination without abnormal findings: Secondary | ICD-10-CM

## 2024-10-13 DIAGNOSIS — E119 Type 2 diabetes mellitus without complications: Secondary | ICD-10-CM

## 2024-10-13 LAB — LIPID PANEL
Cholesterol: 174 mg/dL (ref 28–200)
HDL: 51.9 mg/dL
LDL Cholesterol: 105 mg/dL — ABNORMAL HIGH (ref 10–99)
NonHDL: 122.24
Total CHOL/HDL Ratio: 3
Triglycerides: 87 mg/dL (ref 10.0–149.0)
VLDL: 17.4 mg/dL (ref 0.0–40.0)

## 2024-10-13 LAB — COMPREHENSIVE METABOLIC PANEL WITH GFR
ALT: 34 U/L (ref 3–35)
AST: 25 U/L (ref 5–37)
Albumin: 4.3 g/dL (ref 3.5–5.2)
Alkaline Phosphatase: 57 U/L (ref 39–117)
BUN: 14 mg/dL (ref 6–23)
CO2: 31 meq/L (ref 19–32)
Calcium: 9.5 mg/dL (ref 8.4–10.5)
Chloride: 106 meq/L (ref 96–112)
Creatinine, Ser: 0.76 mg/dL (ref 0.40–1.20)
GFR: 81.27 mL/min
Glucose, Bld: 110 mg/dL — ABNORMAL HIGH (ref 70–99)
Potassium: 4.6 meq/L (ref 3.5–5.1)
Sodium: 142 meq/L (ref 135–145)
Total Bilirubin: 0.9 mg/dL (ref 0.2–1.2)
Total Protein: 7.2 g/dL (ref 6.0–8.3)

## 2024-10-13 LAB — CBC WITH DIFFERENTIAL/PLATELET
Basophils Absolute: 0 10*3/uL (ref 0.0–0.1)
Basophils Relative: 0.3 % (ref 0.0–3.0)
Eosinophils Absolute: 0 10*3/uL (ref 0.0–0.7)
Eosinophils Relative: 0.7 % (ref 0.0–5.0)
HCT: 44.6 % (ref 36.0–46.0)
Hemoglobin: 14.6 g/dL (ref 12.0–15.0)
Lymphocytes Relative: 23.1 % (ref 12.0–46.0)
Lymphs Abs: 1.3 10*3/uL (ref 0.7–4.0)
MCHC: 32.7 g/dL (ref 30.0–36.0)
MCV: 90.5 fl (ref 78.0–100.0)
Monocytes Absolute: 0.3 10*3/uL (ref 0.1–1.0)
Monocytes Relative: 4.8 % (ref 3.0–12.0)
Neutro Abs: 4 10*3/uL (ref 1.4–7.7)
Neutrophils Relative %: 71.1 % (ref 43.0–77.0)
Platelets: 265 10*3/uL (ref 150.0–400.0)
RBC: 4.93 Mil/uL (ref 3.87–5.11)
RDW: 12.8 % (ref 11.5–15.5)
WBC: 5.6 10*3/uL (ref 4.0–10.5)

## 2024-10-13 LAB — MICROALBUMIN / CREATININE URINE RATIO
Creatinine,U: 112.1 mg/dL
Microalb Creat Ratio: UNDETERMINED mg/g (ref 0.0–30.0)
Microalb, Ur: <0.7 mg/dL

## 2024-10-13 LAB — HEMOGLOBIN A1C: Hgb A1c MFr Bld: 6.6 % — ABNORMAL HIGH (ref 4.6–6.5)

## 2024-10-13 NOTE — Patient Instructions (Signed)
 Please read your instructions carefully.   GO TO THE LAB :  Get the blood work    Go to the front desk for the checkout Please make an appointment for a checkup in 6 months        TYPE 2 DIABETES MELLITUS: Your diabetes is well-controlled with an HbA1c of 5.5%. -Continue your current diabetes management regimen and monitor your blood sugar levels regularly.  DYSLIPIDEMIA: We discussed the management of your cholesterol levels. -We checked your cholesterol levels toda, we may consider starting atorvastatin.  POLYNEUROPATHY: You are currently managing your neuropathy with duloxetine . -Continue taking duloxetine  as prescribed for neuropathy management.  OSTEOPENIA: Your osteopenia is being managed with alendronate  and vitamin D . -Continue taking alendronate  once weekly and vitamin D  supplementation as prescribed.  GENERAL HEALTH MAINTENANCE: You are up to date on vaccinations except for the COVID vaccine. -Consider getting the COVID vaccine at the pharmacy.

## 2024-10-13 NOTE — Progress Notes (Unsigned)
 "  Subjective:    Patient ID: Marissa Grant, female    DOB: 1955-11-30, 69 y.o.   MRN: [id number]  DOS:  10/13/2024 CPX  Discussed the use of AI scribe software for clinical note transcription with the patient, who gave verbal consent to proceed.  History of Present Illness Marissa Grant is a 69 year old female who presents for a routine follow-up visit.  She stays physically active at the gym and caring for her grandchildren. She has constipation controlled with carrot and celery juice, papaya juice, and oatmeal with apple. She has no chest pain, shortness of breath, nausea, vomiting, or diarrhea.  Her blood sugar was 5.5 about a month ago, similar to last year. She takes Cymbalta  for neuropathy and alendronate  once weekly for osteopenia, along with vitamins D and C.  She notes increased hunger and some weight gain despite exercising and trying to eat a healthy diet. She limits carbohydrates and focuses on vegetables with chicken, fish, or occasional meat. She exercises on a stationary bike and does abdominal exercises while watching TV.  She has not had a recent COVID vaccine.   Wt Readings from Last 3 Encounters:  10/13/24 147 lb 8 oz (66.9 kg)  02/08/24 146 lb 2 oz (66.3 kg)  10/11/23 142 lb 2 oz (64.5 kg)     Review of Systems See above   Past Medical History:  Diagnosis Date   Acid reflux    Chicken pox as a child   Osteopenia     Past Surgical History:  Procedure Laterality Date   CHOLECYSTECTOMY N/A 05/26/2019   Procedure: LAPAROSCOPIC CHOLECYSTECTOMY;  Surgeon: Rubin Calamity, MD;  Location: [facility];  Service: General;  Laterality: N/A;   COLONOSCOPY  2011   Dr.Peters    Foot surgery - Cotton, Gastroc Left 09/01/2012   1st ray elevatus, Equinus Lt   TUBAL LIGATION     Hysterectomy documented previously- Patient denies hysterectomy, claims she still has uterus and ovaries 03/24/2019. These were also seen on CT scan in 2013    Current Outpatient Medications  Medication  Instructions   alendronate  (FOSAMAX ) 70 mg, Oral, Every 7 days, Take with a full glass of water on an empty stomach.   cholecalciferol (VITAMIN D3) 1,000 Units, Daily   DULoxetine  (CYMBALTA ) 60 mg, Oral, Daily   fluorometholone  (FML LIQUIFILM) 0.1 % ophthalmic suspension Place 1 drop into affected eye(s) 4 (four) times daily. use tapered instructions given by laser defined vision   gatifloxacin  (ZYMAXID ) 0.5 % SOLN Place 1 drop into the affected eye(s) 3 (three) times daily. Store upside down!   MAGNESIUM  PO 2 capsules, Daily       Objective:   Physical Exam BP 118/86   Pulse 76   Temp 97.8 F (36.6 C) (Oral)   Resp 16   Ht 4' 10 (1.473 m)   Wt 147 lb 8 oz (66.9 kg)   SpO2 98%   BMI 30.83 kg/m  General: Well developed, NAD, BMI noted Neck: No  thyromegaly  HEENT:  Normocephalic . Face symmetric, atraumatic Lungs:  CTA B Normal respiratory effort, no intercostal retractions, no accessory muscle use. Heart: RRR,  no murmur.  Abdomen:  Not distended, soft, non-tender. No rebound or rigidity.   Lower extremities: no pretibial edema bilaterally  Skin: Exposed areas without rash. Not pale. Not jaundice Neurologic:  alert & oriented X3.  Speech normal, gait appropriate for age and unassisted Strength symmetric and appropriate for age.  Psych: Cognition and judgment appear intact.  Cooperative with  normal attention span and concentration.  Behavior appropriate. No anxious or depressed appearing.     Assessment   ASSESSMENT  DM (A1c 6.1 102,024) Dyslipidemia GERD-dyspepsia, chronic: saw GI , EGD (-) 2014 Neuropathy: 2016 labs (-), rx gabapentin  (mild response but intolerant); saw neuro 2017;  NCS 10-2015 (-); ABIs wnl 12-2015; saw neuro surgery 12/28, 11/2017 MRI: mild DJD. neurology [facility] 06/20/2021: labs, XR , MRI C-spine, T spine Osteopenia:   Tscore -1.8 (03-2014) , T score -1.9 (09-2018).  T-score -2.4 (2025) started Fosamax .  Here for CPX - Td 02/2024 - s/p  Shingrix ,  PNM 20: 2024.  S/p Shingrix  x 2 - Had a flu shot.   - Recommend COVID-vaccine if not done recently -CCS: Colonoscopy 2011 at [facility], told normal, C-scope again 12-2019, next 2028 per GI letter, Dr Teressa -Female care April 2025: Had a colposcopy, low-grade dysplasia, was rec Pap smear in 1 year  MMG  07/2024 (K PN) -Diet and exercise: Weight gain noted, encouraged a daily walk, monitor carbohydrate intake, watch portions -Labs: CMP FLP CBC A1c micro   Assessment and Plan Assessment & Plan DM: Diet controlled, had a HbA1c of 5.5% at home recently.  Check A1c and micro. Weight gain : Noted in the last few months, we discussed diet and exercise Dyslipidemia   Last cardiovascular risk 10.1%, diet controlled, recheck levels, I recommended to consider statin She will. Polyneuropathy: Continue duloxetine  for neuropathy management. Osteopenia Based on last T-score of -2.4, I offered alendronate  and she agreed.  She also take vitamin D . She is compliant with the regimen. RTC 6 months       "

## 2025-04-13 ENCOUNTER — Ambulatory Visit: Admitting: Internal Medicine

## 2025-10-16 ENCOUNTER — Encounter: Admitting: Internal Medicine
